# Patient Record
Sex: Female | Born: 1965 | Race: White | Hispanic: No | Marital: Married | Smoking: Current every day smoker
Health system: Southern US, Community
[De-identification: ages and names within clinical notes are randomized; demographics above are authoritative.]

## PROBLEM LIST (undated history)

## (undated) DIAGNOSIS — M940 Chondrocostal junction syndrome [Tietze]: Secondary | ICD-10-CM

## (undated) DIAGNOSIS — F32A Depression, unspecified: Secondary | ICD-10-CM

## (undated) DIAGNOSIS — F329 Major depressive disorder, single episode, unspecified: Secondary | ICD-10-CM

## (undated) DIAGNOSIS — R091 Pleurisy: Secondary | ICD-10-CM

## (undated) DIAGNOSIS — M2550 Pain in unspecified joint: Secondary | ICD-10-CM

## (undated) DIAGNOSIS — M549 Dorsalgia, unspecified: Secondary | ICD-10-CM

## (undated) DIAGNOSIS — M7711 Lateral epicondylitis, right elbow: Secondary | ICD-10-CM

## (undated) DIAGNOSIS — J069 Acute upper respiratory infection, unspecified: Secondary | ICD-10-CM

## (undated) DIAGNOSIS — M542 Cervicalgia: Secondary | ICD-10-CM

## (undated) DIAGNOSIS — G5600 Carpal tunnel syndrome, unspecified upper limb: Secondary | ICD-10-CM

## (undated) DIAGNOSIS — N200 Calculus of kidney: Secondary | ICD-10-CM

## (undated) DIAGNOSIS — R110 Nausea: Secondary | ICD-10-CM

## (undated) DIAGNOSIS — G8929 Other chronic pain: Secondary | ICD-10-CM

## (undated) DIAGNOSIS — Z720 Tobacco use: Secondary | ICD-10-CM

## (undated) DIAGNOSIS — N390 Urinary tract infection, site not specified: Secondary | ICD-10-CM

## (undated) DIAGNOSIS — I471 Supraventricular tachycardia, unspecified: Secondary | ICD-10-CM

## (undated) DIAGNOSIS — G4700 Insomnia, unspecified: Secondary | ICD-10-CM

## (undated) DIAGNOSIS — G500 Trigeminal neuralgia: Secondary | ICD-10-CM

## (undated) DIAGNOSIS — J329 Chronic sinusitis, unspecified: Secondary | ICD-10-CM

## (undated) DIAGNOSIS — G5620 Lesion of ulnar nerve, unspecified upper limb: Secondary | ICD-10-CM

## (undated) DIAGNOSIS — R42 Dizziness and giddiness: Secondary | ICD-10-CM

## (undated) DIAGNOSIS — H8309 Labyrinthitis, unspecified ear: Secondary | ICD-10-CM

## (undated) DIAGNOSIS — E7800 Pure hypercholesterolemia, unspecified: Secondary | ICD-10-CM

## (undated) DIAGNOSIS — R079 Chest pain, unspecified: Secondary | ICD-10-CM

## (undated) DIAGNOSIS — M1990 Unspecified osteoarthritis, unspecified site: Secondary | ICD-10-CM

## (undated) DIAGNOSIS — Z8669 Personal history of other diseases of the nervous system and sense organs: Secondary | ICD-10-CM

## (undated) DIAGNOSIS — J209 Acute bronchitis, unspecified: Secondary | ICD-10-CM

## (undated) DIAGNOSIS — J111 Influenza due to unidentified influenza virus with other respiratory manifestations: Secondary | ICD-10-CM

## (undated) DIAGNOSIS — M79672 Pain in left foot: Secondary | ICD-10-CM

## (undated) DIAGNOSIS — Z87442 Personal history of urinary calculi: Secondary | ICD-10-CM

## (undated) DIAGNOSIS — R002 Palpitations: Secondary | ICD-10-CM

## (undated) DIAGNOSIS — Z973 Presence of spectacles and contact lenses: Secondary | ICD-10-CM

## (undated) DIAGNOSIS — M797 Fibromyalgia: Secondary | ICD-10-CM

## (undated) DIAGNOSIS — R5383 Other fatigue: Secondary | ICD-10-CM

## (undated) DIAGNOSIS — R109 Unspecified abdominal pain: Secondary | ICD-10-CM

## (undated) HISTORY — DX: Chondrocostal junction syndrome (tietze): M94.0

## (undated) HISTORY — PX: WISDOM TOOTH EXTRACTION: SHX21

## (undated) HISTORY — DX: Depression, unspecified: F32.A

## (undated) HISTORY — PX: VAGINAL HYSTERECTOMY: SUR661

## (undated) HISTORY — DX: Other fatigue: R53.83

## (undated) HISTORY — DX: Insomnia, unspecified: G47.00

## (undated) HISTORY — DX: Fibromyalgia: M79.7

## (undated) HISTORY — DX: Nausea: R11.0

## (undated) HISTORY — DX: Trigeminal neuralgia: G50.0

## (undated) HISTORY — DX: Supraventricular tachycardia, unspecified: I47.10

## (undated) HISTORY — DX: Labyrinthitis, unspecified ear: H83.09

## (undated) HISTORY — DX: Urinary tract infection, site not specified: N39.0

## (undated) HISTORY — DX: Pure hypercholesterolemia, unspecified: E78.00

## (undated) HISTORY — DX: Tobacco use: Z72.0

## (undated) HISTORY — DX: Pain in left foot: M79.672

## (undated) HISTORY — DX: Lesion of ulnar nerve, unspecified upper limb: G56.20

## (undated) HISTORY — DX: Acute bronchitis, unspecified: J20.9

## (undated) HISTORY — DX: Pleurisy: R09.1

## (undated) HISTORY — DX: Major depressive disorder, single episode, unspecified: F32.9

## (undated) HISTORY — PX: LITHOTRIPSY: SUR834

## (undated) HISTORY — DX: Palpitations: R00.2

## (undated) HISTORY — DX: Unspecified abdominal pain: R10.9

## (undated) HISTORY — DX: Other chronic pain: G89.29

## (undated) HISTORY — DX: Lateral epicondylitis, right elbow: M77.11

## (undated) HISTORY — DX: Dizziness and giddiness: R42

## (undated) HISTORY — DX: Supraventricular tachycardia: I47.1

## (undated) HISTORY — PX: APPENDECTOMY: SHX54

## (undated) HISTORY — DX: Carpal tunnel syndrome, unspecified upper limb: G56.00

## (undated) HISTORY — DX: Chronic sinusitis, unspecified: J32.9

## (undated) HISTORY — DX: Cervicalgia: M54.2

## (undated) HISTORY — DX: Influenza due to unidentified influenza virus with other respiratory manifestations: J11.1

## (undated) HISTORY — DX: Acute upper respiratory infection, unspecified: J06.9

## (undated) HISTORY — DX: Calculus of kidney: N20.0

## (undated) HISTORY — DX: Dorsalgia, unspecified: M54.9

## (undated) HISTORY — DX: Pain in unspecified joint: M25.50

## (undated) HISTORY — DX: Chest pain, unspecified: R07.9

---
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
## 2005-09-01 HISTORY — PX: VAGINAL HYSTERECTOMY: SUR661

## 2015-03-14 ENCOUNTER — Encounter: Payer: Self-pay | Admitting: *Deleted

## 2015-03-15 ENCOUNTER — Encounter: Payer: Self-pay | Admitting: Neurology

## 2015-03-15 ENCOUNTER — Ambulatory Visit (INDEPENDENT_AMBULATORY_CARE_PROVIDER_SITE_OTHER): Payer: BLUE CROSS/BLUE SHIELD | Admitting: Neurology

## 2015-03-15 VITALS — BP 120/80 | HR 97 | Ht 60.0 in | Wt 111.2 lb

## 2015-03-15 DIAGNOSIS — G500 Trigeminal neuralgia: Secondary | ICD-10-CM | POA: Diagnosis not present

## 2015-03-15 MED ORDER — CARBAMAZEPINE 200 MG PO TABS: 200.0000 mg | ORAL_TABLET | Freq: Two times a day (BID) | ORAL | Status: DC

## 2015-03-15 NOTE — Progress Notes (Signed)
Note sent

## 2015-03-15 NOTE — Patient Instructions (Addendum)
MRI brain Stop gabapentin Start carbamezepine  twice daily.  If you develop any rash, stop the medication.  Return to clinic in 3 months

## 2015-03-15 NOTE — Progress Notes (Signed)
[facility] Clinic Note - Initial Visit   Date: 03/15/2015   Karla Scott MRN: [id number] DOB: 05/30/1966   Dear Dr. Sherryll BurgerShah:  Thank you for your kind referral of Karla Cunasrina Strycharz for consultation of trigeminal neuralgia. Although her history is well known to you, please allow us to reiterate it for the purpose of our medical record. The patient was accompanied to the clinic by husband who also provides collateral information.     History of Present Illness: Karla Cunasrina Mccollum is a 49 y.o. left-handed Caucasian female with fibromyalgia, GERD, SVT, anxiety, kidney stones, and current tobacco use presenting for evaluation of right facial pain.    She reports having spells of right facial electrical sensation, diagnosed as trigeminal neuralgia since 2013.  These would always respond to gabapentin.  Starting in early June 2015, she developed similar symptoms of electrical shock-like pain involving the right temporal and cheek area.  She has radiation of pain into her ear and throat. She reports having sensitivity to light touch.  Pain was triggered by chewing, light pressure, and cool air.  She was started on gabapentin but then developed sore throat and ear pain so was also treated for possible sinusitis.  Because of persistent pain, she was given a course of prednisone which alleviated her pain.  She had mild baseline pain.  She reports having MRI brain in 2013 which was reportedly normal (no report or images available to review).  Prior clinic notes have been reviewed.   She takes gabapentin 100mg  in the morning and 200mg  at bedtime.  She has not tried any other medication.  She feels too drowsy on gabapentin so had restricted her driving, although it does help with her symptoms.   Outside medication records: MRI cervical spine 05/13/2013: Degenerative cervical spondylosis with disc disease and facet disease. Mild left foraminal stenosis at C2-C3 and C3-C4. Diffuse bulging  discs and disc protrusions at C4-5 and C5-6.  Mild foraminal encroachment bilaterally at C5-6.  Labs 12/03/2014:  TSH 1.040   Past Medical History  Diagnosis Date  . Trigeminal neuralgia   . Sinusitis   . Tobacco abuse   . Fibromyalgia   . Chronic pain in left foot   . Acute bronchitis   . Chest pain   . Acute upper respiratory infection   . Backache   . Paroxysmal supraventricular tachycardia   . CTS (carpal tunnel syndrome)   . Palpitations   . Pleurisy   . Cervicalgia   . Depressive disorder   . Hypercholesteremia   . Abdominal pain   . Joint pain   . Influenza   . UTI (urinary tract infection)   . Lateral epicondylitis of right elbow   . Insomnia   . Kidney stone   . Ulnar neuropathy at elbow   . Fatigue   . Costalchondritis   . Nausea   . Dizziness   . Viral labyrinthitis     Past Surgical History  Procedure Laterality Date  . Vaginal hysterectomy       Medications:  Current Outpatient Prescriptions on File Prior to Visit  Medication Sig Dispense Refill  . ALPRAZolam (XANAX) 0.25 MG tablet Take 0.25 mg by mouth 2 (two) times daily as needed for anxiety.    Marland Kitchen. atenolol (TENORMIN) 25 MG tablet Take by mouth daily.    . DULoxetine (CYMBALTA) 60 MG capsule Take 60 mg by mouth daily.    . Fish Oil-Cholecalciferol (FISH OIL + D3 PO) Take by mouth.    .Marland Kitchen  gabapentin (NEURONTIN) 100 MG capsule Take 100 mg by mouth 2 (two) times daily.    Marland Kitchen HYDROcodone-acetaminophen (NORCO/VICODIN) 5-325 MG per tablet Take 1 tablet by mouth every 6 (six) hours as needed for moderate pain. 1 po qd and 2 po qhs    . omeprazole (PRILOSEC) 20 MG capsule Take 20 mg by mouth daily.    Marland Kitchen tiZANidine (ZANAFLEX) 4 MG tablet Take 4 mg by mouth at bedtime.    . traZODone (DESYREL) 100 MG tablet Take 100 mg by mouth at bedtime.     No current facility-administered medications on file prior to visit.    Allergies:  Allergies  Allergen Reactions  . Ambien [Zolpidem Tartrate]   . Lipitor  [Atorvastatin]   . Sulfa Antibiotics     Family History: Family History  Problem Relation Age of Onset  . CVA Father   . Kidney disease Mother     Social History: History   Social History  . Marital Status: Married    Spouse Name: N/A  . Number of Children: N/A  . Years of Education: N/A   Occupational History  . Not on file.   Social History Main Topics  . Smoking status: Current Every Day Smoker  . Smokeless tobacco: Never Used  . Alcohol Use: No  . Drug Use: No  . Sexual Activity: Not on file   Other Topics Concern  . Not on file   Social History Narrative   Lives with husband in a one story home.  Has 2 children.  Works as a Office manager.  Education: 4 year degree.    Review of Systems:  CONSTITUTIONAL: No fevers, chills, night sweats, or weight loss.   EYES: No visual changes or eye pain ENT: No hearing changes.  No history of nose bleeds.   RESPIRATORY: No cough, wheezing and shortness of breath.   CARDIOVASCULAR: Negative for chest pain, and palpitations.   GI: Negative for abdominal discomfort, blood in stools or black stools.  No recent change in bowel habits.   GU:  No history of incontinence.   MUSCLOSKELETAL: No history of joint pain or swelling.  No myalgias.   SKIN: Negative for lesions, rash, and itching.   HEMATOLOGY/ONCOLOGY: Negative for prolonged bleeding, bruising easily, and swollen nodes.  No history of cancer.   ENDOCRINE: Negative for cold or heat intolerance, polydipsia or goiter.   PSYCH:  +depression or anxiety symptoms.   NEURO: As Above.   Vital Signs:  BP 120/80 mmHg  Pulse 97  Ht 5' (1.524 m)  Wt 111 lb 3 oz (50.434 kg)  BMI 21.71 kg/m2  SpO2 97%   General Medical Exam:   General:  Well appearing, comfortable.   Eyes/ENT: see cranial nerve examination.   Neck: No masses appreciated.  Full range of motion without tenderness.  No carotid bruits. Respiratory:  Clear to auscultation, good air entry bilaterally.     Cardiac:  Regular rate and rhythm, no murmur.   Extremities:  No deformities, edema, or skin discoloration.  Skin:  No rashes or lesions.  Neurological Exam: MENTAL STATUS including orientation to time, place, person, recent and remote memory, attention span and concentration, language, and fund of knowledge is normal.  Speech is not dysarthric.  CRANIAL NERVES: II:  No visual field defects.  Unremarkable fundi.   III-IV-VI: Pupils equal round and reactive to light.  Normal conjugate, extra-ocular eye movements in all directions of gaze.  No nystagmus.  No ptosis.   V:  Normal facial sensation.     VII:  Normal facial symmetry and movements.  No pathologic facial reflexes.  VIII:  Normal hearing and vestibular function.   IX-X:  Normal palatal movement.   XI:  Normal shoulder shrug and head rotation.   XII:  Normal tongue strength and range of motion, no deviation or fasciculation.  MOTOR:  No atrophy, fasciculations or abnormal movements.  No pronator drift.  Tone is normal.    Right Upper Extremity:    Left Upper Extremity:    Deltoid  5/5   Deltoid  5/5   Biceps  5/5   Biceps  5/5   Triceps  5/5   Triceps  5/5   Wrist extensors  5/5   Wrist extensors  5/5   Wrist flexors  5/5   Wrist flexors  5/5   Finger extensors  5/5   Finger extensors  5/5   Finger flexors  5/5   Finger flexors  5/5   Dorsal interossei  5/5   Dorsal interossei  5/5   Abductor pollicis  5/5   Abductor pollicis  5/5   Tone (Ashworth scale)  0  Tone (Ashworth scale)  0   Right Lower Extremity:    Left Lower Extremity:    Hip flexors  5/5   Hip flexors  5/5   Hip extensors  5/5   Hip extensors  5/5   Knee flexors  5/5   Knee flexors  5/5   Knee extensors  5/5   Knee extensors  5/5   Dorsiflexors  5/5   Dorsiflexors  5/5   Plantarflexors  5/5   Plantarflexors  5/5   Toe extensors  5/5   Toe extensors  5/5   Toe flexors  5/5   Toe flexors  5/5   Tone (Ashworth scale)  0  Tone (Ashworth scale)  0    MSRs:  Right                                                                 Left brachioradialis 2+  brachioradialis 2+  biceps 2+  biceps 2+  triceps 2+  triceps 2+  patellar 2+  patellar 2+  ankle jerk 2+  ankle jerk 2+  Hoffman no  Hoffman no  plantar response down  plantar response down   SENSORY:  Normal and symmetric perception of light touch, pinprick, vibration, and proprioception.  Romberg's sign absent.   COORDINATION/GAIT: Normal finger-to- nose-finger and heel-to-shin.  Intact rapid alternating movements bilaterally.  Able to rise from a chair without using arms.  Gait narrow based and stable. Tandem and stressed gait intact.    IMPRESSION/PLAN: Mr. Zendejas is a 49 year-old female referred for evaluation of right trigeminal neuralgia, but there are some atypical features such as pain radiating into her ear and throat.  Possible that some of this may be due to sinusitis.  Exam is non-focal and her pain is better after completing steroid taper as well as being on gabapentin /d.  Unfortunately, she is experiencing sedative side effects, so I will switch her to carbamazepine  twice daily.  Common side effect discussed.  I will order MRI brain with attention to trigeminal nerve to look for vascular loop which can sometimes be the underlying  etiology.  Return to clinic in 3 months   The duration of this appointment visit was 40 minutes of face-to-face time with the patient.  Greater than 50% of this time was spent in counseling, explanation of diagnosis, planning of further management, and coordination of care.   Thank you for allowing me to participate in patient's care.  If I can answer any additional questions, I would be pleased to do so.    Sincerely,    Gabby Rackers K. Allena Katz, DO

## 2015-03-20 ENCOUNTER — Ambulatory Visit
Admission: RE | Admit: 2015-03-20 | Discharge: 2015-03-20 | Disposition: A | Discharge status: Home / Self Care | Payer: BLUE CROSS/BLUE SHIELD | Source: Ambulatory Visit | Attending: Neurology | Admitting: Neurology

## 2015-03-20 DIAGNOSIS — G500 Trigeminal neuralgia: Secondary | ICD-10-CM

## 2015-03-20 MED ORDER — GADOBENATE DIMEGLUMINE 529 MG/ML IV SOLN
10.0000 mL | Freq: Once | INTRAVENOUS | Status: AC | PRN
  Administered 2015-03-20: 10 mL via INTRAVENOUS

## 2015-06-15 ENCOUNTER — Other Ambulatory Visit: Payer: Self-pay | Admitting: Neurology

## 2015-06-15 NOTE — Telephone Encounter (Signed)
Rx sent 

## 2015-06-21 ENCOUNTER — Ambulatory Visit: Payer: 59 | Admitting: Neurology

## 2015-09-02 HISTORY — PX: CYSTOSCOPY W/ URETEROSCOPY W/ LITHOTRIPSY: SUR380

## 2015-09-13 ENCOUNTER — Encounter: Payer: Self-pay | Admitting: Neurology

## 2015-09-13 ENCOUNTER — Ambulatory Visit (INDEPENDENT_AMBULATORY_CARE_PROVIDER_SITE_OTHER): Payer: BLUE CROSS/BLUE SHIELD | Admitting: Neurology

## 2015-09-13 VITALS — BP 110/80 | HR 96 | Wt 114.5 lb

## 2015-09-13 DIAGNOSIS — G500 Trigeminal neuralgia: Secondary | ICD-10-CM | POA: Diagnosis not present

## 2015-09-13 MED ORDER — OXCARBAZEPINE 150 MG PO TABS: 150.0000 mg | ORAL_TABLET | Freq: Two times a day (BID) | ORAL | Status: DC

## 2015-09-13 NOTE — Progress Notes (Signed)
Follow-up Visit   Date: 09/13/2015    Karla Scott MRN: [id number] DOB: February 19, 1966   Interim History: Karla Scott is a 50 y.o. left-handed Caucasian female with fibromyalgia, GERD, SVT, anxiety, kidney stones, and current tobacco use returning to the clinic for follow-up of right trigeminal neuralgia.  The patient was accompanied to the clinic by aunt who also provides collateral information.    History of present illness: She reports having spells of right facial electrical sensation, diagnosed as trigeminal neuralgia since 2013. These would always respond to gabapentin. Starting in early June 2015, she developed similar symptoms of electrical shock-like pain involving the right temporal and cheek area. She has radiation of pain into her ear and throat. She reports having sensitivity to light touch. Pain was triggered by chewing, light pressure, and cool air. She was started on gabapentin but then developed sore throat and ear pain so was also treated for possible sinusitis. Because of persistent pain, she was given a course of prednisone which alleviated her pain. She had mild baseline pain. She reports having MRI brain in 2013 which was reportedly normal (no report or images available to review). Prior clinic notes have been reviewed.   She takes gabapentin 100mg  in the morning and 200mg  at bedtime. She feels too drowsy on gabapentin so had restricted her driving, although it does help with her symptoms.   UPDATE 09/13/2015:   She noticed a huge improvement in her pain with carbamazepine 200mg  twice daily, but did have some difficulty focusing, lightheadedness, and nausea.  She tried reducing the dose to 100mg  at bedtime but felt that her pain would worsen, so she resumed taking it 200mg  twice daily.  Her last bout of pain was in early December.  She no longer has pain radiating into her ear and throat and reports that this may have been due to sinusitis and unrelated to her  facial pain.     Medications:  Current Outpatient Prescriptions on File Prior to Visit  Medication Sig Dispense Refill  . ALPRAZolam (XANAX) 0.25 MG tablet Take 0.25 mg by mouth 2 (two) times daily as needed for anxiety.    Marland Kitchen atenolol (TENORMIN) 25 MG tablet Take by mouth daily.    . DULoxetine (CYMBALTA) 60 MG capsule Take 60 mg by mouth daily.    . Fish Oil-Cholecalciferol (FISH OIL + D3 PO) Take by mouth.    Marland Kitchen HYDROcodone-acetaminophen (NORCO/VICODIN) 5-325 MG per tablet Take 1 tablet by mouth every 6 (six) hours as needed for moderate pain. 1 po qd and 2 po qhs    . omeprazole (PRILOSEC) 20 MG capsule Take 20 mg by mouth daily.    Marland Kitchen tiZANidine (ZANAFLEX) 4 MG tablet Take 4 mg by mouth at bedtime.    . traZODone (DESYREL) 100 MG tablet Take 100 mg by mouth at bedtime.     No current facility-administered medications on file prior to visit.    Allergies:  Allergies  Allergen Reactions  . Ambien [Zolpidem Tartrate]   . Lipitor [Atorvastatin]   . Sulfa Antibiotics     Review of Systems:  CONSTITUTIONAL: No fevers, chills, night sweats, or weight loss.  EYES: No visual changes or eye pain ENT: No hearing changes.  No history of nose bleeds.   RESPIRATORY: No cough, wheezing and shortness of breath.   CARDIOVASCULAR: Negative for chest pain, and palpitations.   GI: Negative for abdominal discomfort, blood in stools or black stools.  No recent change in bowel habits.  GU:  No history of incontinence.   MUSCLOSKELETAL: No history of joint pain or swelling.  No myalgias.   SKIN: Negative for lesions, rash, and itching.   ENDOCRINE: Negative for cold or heat intolerance, polydipsia or goiter.   PSYCH:  + depression or anxiety symptoms.   NEURO: As Above.   Vital Signs:  BP 110/80 mmHg  Pulse 96  Wt 114 lb 8 oz (51.937 kg)  SpO2 96%  Neurological Exam: MENTAL STATUS including orientation to time, place, person, recent and remote memory, attention span and concentration,  language, and fund of knowledge is normal.  Speech is not dysarthric.  CRANIAL NERVES:  Pupils equal round and reactive to light.  Normal conjugate, extra-ocular eye movements in all directions of gaze.  No ptosis. Normal facial sensation.  Face is symmetric. Palate elevates symmetrically.  Tongue is midline.  MOTOR:  Motor strength is 5/5 in all extremities  No pronator drift.  Tone is normal.    MSRs:  Reflexes are 2+/4 throughout.  SENSORY:  Intact to light touch and vibration throughout.  COORDINATION/GAIT:    Gait narrow based and stable.   Data: MRI cervical spine 05/13/2013:  Degenerative cervical spondylosis with disc disease and facet disease.  Mild left foraminal stenosis at C2-C3 and C3-C4.  Diffuse bulging discs and disc protrusions at C4-5 and C5-6. Mild foraminal encroachment bilaterally at C5-6.  MRI brain/orbit wwo contrast 03/20/2015:  No explanation for right facial pain.  Labs 12/03/2014: TSH 1.040   IMPRESSION/PLAN: Right trigeminal neuralgia, improved on carbamazepine but she is experiencing side effects of lightheadedness and cognitive slowing, so will switch her to oxcarbamazepine 150mg  twice daily and uptitrate as needed.  Common side effects discussed.  She can follow-up with her pain provider for trigeminal nerve block, if needed. Call with update in 1 month to decide if oxcarbamazepine needs to be increased to 300mg  twice daily From a neurological stand-point, there are no driving restrictions. I recommend she use her own judgement and safety precautions when driving.    Return to clinic as needed   The duration of this appointment visit was 25 minutes of face-to-face time with the patient.  Greater than 50% of this time was spent in counseling, explanation of diagnosis, planning of further management, and coordination of care.   Thank you for allowing me to participate in patient's care.  If I can answer any additional questions, I would be pleased to do so.      Sincerely,    Leydi Winstead K. Allena KatzPatel, DO

## 2015-09-13 NOTE — Progress Notes (Signed)
Note routed

## 2015-09-13 NOTE — Patient Instructions (Addendum)
Start oxcarbamazepine 150mg  twice daily. Stop carbamazepine  Call the office with an update in 1 month

## 2015-09-14 ENCOUNTER — Telehealth: Payer: Self-pay | Admitting: Neurology

## 2015-09-14 NOTE — Telephone Encounter (Signed)
Left another message for [facility].

## 2015-09-14 NOTE — Telephone Encounter (Signed)
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

## 2015-09-14 NOTE — Telephone Encounter (Signed)
Left message on Karla Scott's vm.

## 2015-10-05 ENCOUNTER — Encounter: Payer: Self-pay | Admitting: Neurology

## 2015-12-24 HISTORY — PX: CRANIOTOMY: SHX93

## 2016-05-28 ENCOUNTER — Other Ambulatory Visit: Payer: Self-pay | Admitting: Internal Medicine

## 2016-05-28 DIAGNOSIS — R319 Hematuria, unspecified: Secondary | ICD-10-CM

## 2016-05-28 DIAGNOSIS — R109 Unspecified abdominal pain: Secondary | ICD-10-CM

## 2016-05-28 DIAGNOSIS — N200 Calculus of kidney: Secondary | ICD-10-CM

## 2016-05-29 ENCOUNTER — Inpatient Hospital Stay (HOSPITAL_COMMUNITY): Payer: BLUE CROSS/BLUE SHIELD | Admitting: Registered Nurse

## 2016-05-29 ENCOUNTER — Inpatient Hospital Stay (HOSPITAL_COMMUNITY)
Admission: EM | Admit: 2016-05-29 | Discharge: 2016-05-30 | DRG: 872 | Disposition: A | Discharge status: Home / Self Care | Payer: BLUE CROSS/BLUE SHIELD | Attending: Internal Medicine | Admitting: Internal Medicine

## 2016-05-29 ENCOUNTER — Encounter (HOSPITAL_COMMUNITY): Payer: Self-pay | Admitting: Emergency Medicine

## 2016-05-29 ENCOUNTER — Ambulatory Visit
Admission: RE | Admit: 2016-05-29 | Discharge: 2016-05-29 | Disposition: A | Discharge status: Home / Self Care | Payer: BLUE CROSS/BLUE SHIELD | Source: Ambulatory Visit | Attending: Internal Medicine | Admitting: Internal Medicine

## 2016-05-29 ENCOUNTER — Encounter (HOSPITAL_COMMUNITY)
Admission: EM | Disposition: A | Discharge status: Home / Self Care | Payer: Self-pay | Source: Home / Self Care | Attending: Family Medicine

## 2016-05-29 DIAGNOSIS — A419 Sepsis, unspecified organism: Secondary | ICD-10-CM | POA: Diagnosis not present

## 2016-05-29 DIAGNOSIS — N1330 Unspecified hydronephrosis: Secondary | ICD-10-CM | POA: Diagnosis not present

## 2016-05-29 DIAGNOSIS — N200 Calculus of kidney: Secondary | ICD-10-CM | POA: Diagnosis not present

## 2016-05-29 DIAGNOSIS — G500 Trigeminal neuralgia: Secondary | ICD-10-CM | POA: Diagnosis present

## 2016-05-29 DIAGNOSIS — N136 Pyonephrosis: Secondary | ICD-10-CM | POA: Diagnosis present

## 2016-05-29 DIAGNOSIS — E876 Hypokalemia: Secondary | ICD-10-CM | POA: Diagnosis not present

## 2016-05-29 DIAGNOSIS — M797 Fibromyalgia: Secondary | ICD-10-CM | POA: Diagnosis present

## 2016-05-29 DIAGNOSIS — A4151 Sepsis due to Escherichia coli [E. coli]: Principal | ICD-10-CM | POA: Diagnosis present

## 2016-05-29 DIAGNOSIS — F17210 Nicotine dependence, cigarettes, uncomplicated: Secondary | ICD-10-CM | POA: Diagnosis present

## 2016-05-29 DIAGNOSIS — E7800 Pure hypercholesterolemia, unspecified: Secondary | ICD-10-CM | POA: Diagnosis present

## 2016-05-29 DIAGNOSIS — R319 Hematuria, unspecified: Secondary | ICD-10-CM

## 2016-05-29 DIAGNOSIS — F329 Major depressive disorder, single episode, unspecified: Secondary | ICD-10-CM | POA: Diagnosis present

## 2016-05-29 DIAGNOSIS — F419 Anxiety disorder, unspecified: Secondary | ICD-10-CM | POA: Diagnosis present

## 2016-05-29 DIAGNOSIS — N202 Calculus of kidney with calculus of ureter: Secondary | ICD-10-CM | POA: Diagnosis present

## 2016-05-29 DIAGNOSIS — Z882 Allergy status to sulfonamides status: Secondary | ICD-10-CM | POA: Diagnosis not present

## 2016-05-29 DIAGNOSIS — Z888 Allergy status to other drugs, medicaments and biological substances status: Secondary | ICD-10-CM | POA: Diagnosis not present

## 2016-05-29 DIAGNOSIS — Z841 Family history of disorders of kidney and ureter: Secondary | ICD-10-CM

## 2016-05-29 DIAGNOSIS — F17200 Nicotine dependence, unspecified, uncomplicated: Secondary | ICD-10-CM | POA: Diagnosis present

## 2016-05-29 DIAGNOSIS — R109 Unspecified abdominal pain: Secondary | ICD-10-CM | POA: Diagnosis present

## 2016-05-29 DIAGNOSIS — G4700 Insomnia, unspecified: Secondary | ICD-10-CM | POA: Diagnosis present

## 2016-05-29 DIAGNOSIS — N201 Calculus of ureter: Secondary | ICD-10-CM

## 2016-05-29 HISTORY — PX: CYSTOSCOPY WITH STENT PLACEMENT: SHX5790

## 2016-05-29 LAB — BASIC METABOLIC PANEL
Anion gap: 7 (ref 5–15)
BUN: 12 mg/dL (ref 6–20)
CO2: 26 mmol/L (ref 22–32)
CREATININE: 0.85 mg/dL (ref 0.44–1.00)
Calcium: 8.8 mg/dL — ABNORMAL LOW (ref 8.9–10.3)
Chloride: 104 mmol/L (ref 101–111)
GFR calc Af Amer: >60 mL/min (ref >60)
GFR calc non Af Amer: >60 mL/min (ref >60)
Glucose, Bld: 99 mg/dL (ref 65–99)
Potassium: 3.4 mmol/L — ABNORMAL LOW (ref 3.5–5.1)
SODIUM: 137 mmol/L (ref 135–145)

## 2016-05-29 LAB — CBC WITH DIFFERENTIAL/PLATELET
Basophils Absolute: 0 10*3/uL (ref 0.0–0.1)
Basophils Relative: 0 %
EOS ABS: 0 10*3/uL (ref 0.0–0.7)
Eosinophils Relative: 0 %
HCT: 41.1 % (ref 36.0–46.0)
Hemoglobin: 13.9 g/dL (ref 12.0–15.0)
Lymphocytes Relative: 12 %
Lymphs Abs: 1.9 10*3/uL (ref 0.7–4.0)
MCH: 33 pg (ref 26.0–34.0)
MCHC: 33.8 g/dL (ref 30.0–36.0)
MCV: 97.6 fL (ref 78.0–100.0)
MONO ABS: 1 10*3/uL (ref 0.1–1.0)
MONOS PCT: 7 %
NEUTROS PCT: 81 %
Neutro Abs: 12.2 10*3/uL — ABNORMAL HIGH (ref 1.7–7.7)
Platelets: 233 10*3/uL (ref 150–400)
RBC: 4.21 MIL/uL (ref 3.87–5.11)
RDW: 12.6 % (ref 11.5–15.5)
WBC: 15.1 10*3/uL — ABNORMAL HIGH (ref 4.0–10.5)

## 2016-05-29 LAB — URINALYSIS, ROUTINE W REFLEX MICROSCOPIC
BILIRUBIN URINE: NEGATIVE
GLUCOSE, UA: NEGATIVE mg/dL
Ketones, ur: NEGATIVE mg/dL
Nitrite: POSITIVE — AB
PH: 6 (ref 5.0–8.0)
Protein, ur: 30 mg/dL — AB
SPECIFIC GRAVITY, URINE: 1.01 (ref 1.005–1.030)

## 2016-05-29 LAB — LACTIC ACID, PLASMA
LACTIC ACID, VENOUS: 1.1 mmol/L (ref 0.5–1.9)
Lactic Acid, Venous: 1.2 mmol/L (ref 0.5–1.9)

## 2016-05-29 LAB — URINE MICROSCOPIC-ADD ON

## 2016-05-29 SURGERY — CYSTOSCOPY, WITH STENT INSERTION
Anesthesia: General | Site: Ureter | Laterality: Right

## 2016-05-29 MED ORDER — CIPROFLOXACIN IN D5W 400 MG/200ML IV SOLN
400.0000 mg | Freq: Once | INTRAVENOUS | Status: AC
  Administered 2016-05-29: 400 mg via INTRAVENOUS

## 2016-05-29 MED ORDER — DEXAMETHASONE SODIUM PHOSPHATE 10 MG/ML IJ SOLN
INTRAMUSCULAR | Status: DC | PRN
  Administered 2016-05-29: 10 mg via INTRAVENOUS

## 2016-05-29 MED ORDER — PROMETHAZINE HCL 25 MG/ML IJ SOLN: 6.2500 mg | INTRAMUSCULAR | Status: DC | PRN

## 2016-05-29 MED ORDER — MEPERIDINE HCL 25 MG/ML IJ SOLN: 6.2500 mg | INTRAMUSCULAR | Status: DC | PRN

## 2016-05-29 MED ORDER — ONDANSETRON HCL 4 MG/2ML IJ SOLN
INTRAMUSCULAR | Status: DC | PRN
  Administered 2016-05-29: 4 mg via INTRAVENOUS

## 2016-05-29 MED ORDER — ALPRAZOLAM 0.25 MG PO TABS: 0.2500 mg | ORAL_TABLET | Freq: Two times a day (BID) | ORAL | Status: DC | PRN

## 2016-05-29 MED ORDER — BELLADONNA ALKALOIDS-OPIUM 16.2-60 MG RE SUPP
RECTAL | Status: DC | PRN
  Administered 2016-05-29: 1 via RECTAL

## 2016-05-29 MED ORDER — BELLADONNA ALKALOIDS-OPIUM 16.2-60 MG RE SUPP
RECTAL | Status: AC
  Filled 2016-05-29: qty 1

## 2016-05-29 MED ORDER — SODIUM CHLORIDE 0.9 % IV SOLN: 250.0000 mL | INTRAVENOUS | Status: DC | PRN

## 2016-05-29 MED ORDER — HYDROCODONE-ACETAMINOPHEN 5-325 MG PO TABS: 1.0000 | ORAL_TABLET | Freq: Four times a day (QID) | ORAL | Status: DC | PRN

## 2016-05-29 MED ORDER — LIDOCAINE HCL (CARDIAC) 20 MG/ML IV SOLN
INTRAVENOUS | Status: DC | PRN
  Administered 2016-05-29: 75 mg via INTRAVENOUS
  Administered 2016-05-29: 25 mg via INTRATRACHEAL

## 2016-05-29 MED ORDER — DEXTROSE 5 % IV SOLN
1.0000 g | Freq: Once | INTRAVENOUS | Status: AC
  Administered 2016-05-29: 1 g via INTRAVENOUS
  Filled 2016-05-29: qty 10

## 2016-05-29 MED ORDER — TRAZODONE HCL 50 MG PO TABS
150.0000 mg | ORAL_TABLET | Freq: Every day | ORAL | Status: DC
  Administered 2016-05-29: 150 mg via ORAL
  Filled 2016-05-29: qty 3

## 2016-05-29 MED ORDER — ACETAMINOPHEN 325 MG PO TABS: 650.0000 mg | ORAL_TABLET | Freq: Four times a day (QID) | ORAL | Status: DC | PRN

## 2016-05-29 MED ORDER — ACETAMINOPHEN 650 MG RE SUPP: 650.0000 mg | Freq: Four times a day (QID) | RECTAL | Status: DC | PRN

## 2016-05-29 MED ORDER — ROCURONIUM BROMIDE 100 MG/10ML IV SOLN
INTRAVENOUS | Status: DC | PRN
  Administered 2016-05-29: 5 mg via INTRAVENOUS

## 2016-05-29 MED ORDER — CIPROFLOXACIN IN D5W 400 MG/200ML IV SOLN: 400.0000 mg | INTRAVENOUS | Status: DC

## 2016-05-29 MED ORDER — DULOXETINE HCL 60 MG PO CPEP
60.0000 mg | ORAL_CAPSULE | Freq: Every day | ORAL | Status: DC
  Administered 2016-05-30: 60 mg via ORAL
  Filled 2016-05-29: qty 1

## 2016-05-29 MED ORDER — DEXAMETHASONE SODIUM PHOSPHATE 10 MG/ML IJ SOLN
INTRAMUSCULAR | Status: AC
  Filled 2016-05-29: qty 1

## 2016-05-29 MED ORDER — SODIUM CHLORIDE 0.9% FLUSH: 3.0000 mL | Freq: Two times a day (BID) | INTRAVENOUS | Status: DC

## 2016-05-29 MED ORDER — LACTATED RINGERS IV SOLN
INTRAVENOUS | Status: DC | PRN
  Administered 2016-05-29 (×2): via INTRAVENOUS

## 2016-05-29 MED ORDER — CIPROFLOXACIN IN D5W 400 MG/200ML IV SOLN
INTRAVENOUS | Status: AC
  Filled 2016-05-29: qty 200

## 2016-05-29 MED ORDER — DEXTROSE 5 % IV SOLN
1.0000 g | INTRAVENOUS | Status: DC
  Filled 2016-05-29: qty 10

## 2016-05-29 MED ORDER — MORPHINE SULFATE (PF) 4 MG/ML IV SOLN
4.0000 mg | INTRAVENOUS | Status: DC | PRN
  Administered 2016-05-29: 4 mg via INTRAVENOUS
  Filled 2016-05-29: qty 1

## 2016-05-29 MED ORDER — ONDANSETRON HCL 4 MG/2ML IJ SOLN
INTRAMUSCULAR | Status: AC
  Filled 2016-05-29: qty 2

## 2016-05-29 MED ORDER — MIDAZOLAM HCL 2 MG/2ML IJ SOLN
INTRAMUSCULAR | Status: AC
  Filled 2016-05-29: qty 2

## 2016-05-29 MED ORDER — SODIUM CHLORIDE 0.9 % IV BOLUS (SEPSIS)
1000.0000 mL | Freq: Once | INTRAVENOUS | Status: AC
Start: 2016-05-29 — End: 2016-05-29
  Administered 2016-05-29: 1000 mL via INTRAVENOUS

## 2016-05-29 MED ORDER — SODIUM CHLORIDE 0.9 % IV SOLN
INTRAVENOUS | Status: DC
  Administered 2016-05-29 – 2016-05-30 (×3): via INTRAVENOUS

## 2016-05-29 MED ORDER — HYDROMORPHONE HCL 1 MG/ML IJ SOLN
0.2500 mg | INTRAMUSCULAR | Status: DC | PRN
  Administered 2016-05-29 (×2): 0.5 mg via INTRAVENOUS

## 2016-05-29 MED ORDER — HYDROMORPHONE HCL 1 MG/ML IJ SOLN
INTRAMUSCULAR | Status: AC
  Filled 2016-05-29: qty 1

## 2016-05-29 MED ORDER — SUCCINYLCHOLINE CHLORIDE 20 MG/ML IJ SOLN
INTRAMUSCULAR | Status: DC | PRN
  Administered 2016-05-29: 100 mg via INTRAVENOUS

## 2016-05-29 MED ORDER — FENTANYL CITRATE (PF) 100 MCG/2ML IJ SOLN
INTRAMUSCULAR | Status: DC | PRN
  Administered 2016-05-29 (×2): 50 ug via INTRAVENOUS

## 2016-05-29 MED ORDER — SODIUM CHLORIDE 0.9% FLUSH: 3.0000 mL | INTRAVENOUS | Status: DC | PRN

## 2016-05-29 MED ORDER — ACETAMINOPHEN 10 MG/ML IV SOLN
INTRAVENOUS | Status: DC | PRN
  Administered 2016-05-29: 1000 mg via INTRAVENOUS

## 2016-05-29 MED ORDER — KETOROLAC TROMETHAMINE 30 MG/ML IJ SOLN
INTRAMUSCULAR | Status: DC | PRN
  Administered 2016-05-29: 30 mg via INTRAVENOUS

## 2016-05-29 MED ORDER — ONDANSETRON HCL 4 MG/2ML IJ SOLN: 4.0000 mg | Freq: Four times a day (QID) | INTRAMUSCULAR | Status: DC | PRN

## 2016-05-29 MED ORDER — MEPERIDINE HCL 50 MG/ML IJ SOLN: 6.2500 mg | INTRAMUSCULAR | Status: DC | PRN

## 2016-05-29 MED ORDER — SODIUM CHLORIDE 0.9 % IR SOLN
Status: DC | PRN
  Administered 2016-05-29: 3000 mL

## 2016-05-29 MED ORDER — OXYCODONE HCL 5 MG PO TABS: 5.0000 mg | ORAL_TABLET | ORAL | Status: DC | PRN

## 2016-05-29 MED ORDER — SODIUM CHLORIDE 0.9 % IV BOLUS (SEPSIS)
1000.0000 mL | Freq: Once | INTRAVENOUS | Status: AC
  Administered 2016-05-29: 1000 mL via INTRAVENOUS

## 2016-05-29 MED ORDER — IOHEXOL 300 MG/ML  SOLN
INTRAMUSCULAR | Status: DC | PRN
  Administered 2016-05-29: 5 mL via URETHRAL

## 2016-05-29 MED ORDER — ONDANSETRON HCL 4 MG PO TABS: 4.0000 mg | ORAL_TABLET | Freq: Four times a day (QID) | ORAL | Status: DC | PRN

## 2016-05-29 MED ORDER — FENTANYL CITRATE (PF) 100 MCG/2ML IJ SOLN
INTRAMUSCULAR | Status: AC
  Filled 2016-05-29: qty 2

## 2016-05-29 MED ORDER — POLYETHYLENE GLYCOL 3350 17 G PO PACK: 17.0000 g | PACK | Freq: Every day | ORAL | Status: DC | PRN

## 2016-05-29 MED ORDER — TRAZODONE HCL 50 MG PO TABS: 50.0000 mg | ORAL_TABLET | Freq: Every evening | ORAL | Status: DC | PRN

## 2016-05-29 MED ORDER — MIDAZOLAM HCL 5 MG/5ML IJ SOLN
INTRAMUSCULAR | Status: DC | PRN
  Administered 2016-05-29: 1 mg via INTRAVENOUS

## 2016-05-29 MED ORDER — SENNA 8.6 MG PO TABS
1.0000 | ORAL_TABLET | Freq: Two times a day (BID) | ORAL | Status: DC
  Administered 2016-05-29 – 2016-05-30 (×2): 8.6 mg via ORAL
  Filled 2016-05-29 (×2): qty 1

## 2016-05-29 MED ORDER — PROPOFOL 10 MG/ML IV BOLUS
INTRAVENOUS | Status: AC
  Filled 2016-05-29: qty 20

## 2016-05-29 MED ORDER — ALBUTEROL SULFATE (2.5 MG/3ML) 0.083% IN NEBU: 2.5000 mg | INHALATION_SOLUTION | RESPIRATORY_TRACT | Status: DC | PRN

## 2016-05-29 MED ORDER — PROPOFOL 10 MG/ML IV BOLUS
INTRAVENOUS | Status: DC | PRN
  Administered 2016-05-29: 25 mg via INTRAVENOUS
  Administered 2016-05-29: 175 mg via INTRAVENOUS

## 2016-05-29 MED ORDER — ACETAMINOPHEN 10 MG/ML IV SOLN
INTRAVENOUS | Status: AC
  Filled 2016-05-29: qty 100

## 2016-05-29 SURGICAL SUPPLY — 16 items
BAG URO CATCHER STRL LF (MISCELLANEOUS) ×3 IMPLANT
BASKET ZERO TIP NITINOL 2.4FR (BASKET) IMPLANT
CATH FOLEY 2WAY SLVR  5CC 16FR (CATHETERS) ×2
CATH FOLEY 2WAY SLVR 5CC 16FR (CATHETERS) ×1 IMPLANT
CATH INTERMIT  6FR 70CM (CATHETERS) ×3 IMPLANT
CLOTH BEACON ORANGE TIMEOUT ST (SAFETY) ×3 IMPLANT
GLOVE BIOGEL M STRL SZ7.5 (GLOVE) ×9 IMPLANT
GOWN STRL REUS W/TWL LRG LVL3 (GOWN DISPOSABLE) ×3 IMPLANT
GOWN STRL REUS W/TWL XL LVL3 (GOWN DISPOSABLE) ×3 IMPLANT
GUIDEWIRE ANG ZIPWIRE 038X150 (WIRE) IMPLANT
GUIDEWIRE STR DUAL SENSOR (WIRE) ×3 IMPLANT
MANIFOLD NEPTUNE II (INSTRUMENTS) ×3 IMPLANT
PACK CYSTO (CUSTOM PROCEDURE TRAY) ×3 IMPLANT
STENT CONTOUR 6FRX24X.038 (STENTS) ×3 IMPLANT
TUBING CONNECTING 10 (TUBING) ×2 IMPLANT
TUBING CONNECTING 10' (TUBING) ×1

## 2016-05-29 NOTE — ED Notes (Signed)
Pt made aware a urine specimen was needed. Pt verbalized understanding and will notify staff when one can be obtained.   

## 2016-05-29 NOTE — Anesthesia Procedure Notes (Signed)
Procedure Name: Intubation Date/Time: 05/29/2016 9:58 PM Performed by: Illene SilverEVANS, Mikie Misner E Pre-anesthesia Checklist: Patient identified, Emergency Drugs available, Suction available and Patient being monitored Patient Re-evaluated:Patient Re-evaluated prior to inductionOxygen Delivery Method: Circle system utilized Preoxygenation: Pre-oxygenation with 100% oxygen Intubation Type: IV induction Ventilation: Mask ventilation without difficulty Laryngoscope Size: Mac and 3 Grade View: Grade II Tube type: Oral Tube size: 7.0 mm Number of attempts: 1 Airway Equipment and Method: Stylet and Oral airway Placement Confirmation: ETT inserted through vocal cords under direct vision,  positive ETCO2 and breath sounds checked- equal and bilateral Secured at: 21 cm Tube secured with: Tape Dental Injury: Teeth and Oropharynx as per pre-operative assessment

## 2016-05-29 NOTE — ED Notes (Signed)
EDP at bedside updating patient and family. 

## 2016-05-29 NOTE — Op Note (Signed)
Preoperative diagnosis: Right distal ureteral stone, hydronephrosis, UTI, sepsis Postoperative diagnosis: Same  Procedure: Cystoscopy, right retrograde pyelogram, right ureteral stent placement  Surgeon: Mena GoesEskridge  Anesthesia: Gen.  Indication for procedure 50 year old female with above diagnosis brought for urgent ureteral stenting.  Findings: On cystoscopy the urethra and the bladder unremarkable. There were no stones or foreign bodies in the bladder. There was some edema and erythema over the right ureteral orifice.  Right retrograde pyelogram-this outlined a single ureter single collecting system unit with a filling defect in the intramural portion of the very distal ureter with proximal hydroureteronephrosis. Initial retrograde only progressed contrast to the mid ureter so I repeated it once I was able to get 6 [place] open-ended catheter past the stone.  Description of procedure: After consent was obtained patient brought to the operating room. After adequate anesthesia she was placed in lithotomy position and prepped and draped in the usual sterile fashion. A timeout was performed to confirm the patient and procedure. The cystoscope was passed per urethra and the bladder inspected. A 6 French open-ended catheter was used to cannulate the right ureteral orifice and retrograde injection of contrast was performed. I could not see the stone crowning and therefore felt ureteroscopy would be too risky. A sensor wire was advanced past the stone and I tried to pass a 6 [place] open-ended catheter but there was quite a bit of resistance. With some pressure on the catheter it did pass retrograde and I passed it into the proximal ureter and remove the wire. There was a good hydronephrotic drip. I reinjected some contrast which confirmed proper placement in the lumen. Contrast did fill proximally and distally down to the ureterovesical junction and the ureter was noted to be intact. The sensor wire was  readvanced into the upper pole collecting system in the 6 [place] open-ended removed. A 6 x 24 cm stent was advanced. The wire was removed with a good coil seen in the right renal pelvis and a good coil in the bladder. A 16 French Foley was placed a max drain the system overnight and connected to gravity drainage. She was awakened and taken to the recovery room in stable condition.  Complications: None Blood loss: Minimal Specimens: None Drains: 6 x 24 cm right ureteral stent

## 2016-05-29 NOTE — Consult Note (Signed)
Consult: sepsis, right ureteral stone, hydronephrosis Requested by: Dr. Manus Gunningancour; Dr. Shon Haleourage Emokpae  History of Present Illness: 50 yo female s/p CT this AM which shows a 5 mm distal right stone. She has sepsis and a dirty UA and needs an urgent stent. She started with right flank pain, N/V last night. She has not felt a stone pass and continues to have pain.   She has a history of kidney stones with stone passage and ESWL utilized in the past. She had not had URS or a stent. She typically follows with Dr. Nechama GuardBauer.   Past Medical History:  Diagnosis Date  . Abdominal pain   . Acute bronchitis   . Acute upper respiratory infection   . Backache   . Cervicalgia   . Chest pain   . Chronic pain in left foot   . Costalchondritis   . CTS (carpal tunnel syndrome)   . Depressive disorder   . Dizziness   . Fatigue   . Fibromyalgia   . Hypercholesteremia   . Influenza   . Insomnia   . Joint pain   . Kidney stone   . Lateral epicondylitis of right elbow   . Nausea   . Palpitations   . Paroxysmal supraventricular tachycardia (HCC)   . Pleurisy   . Sinusitis   . Tobacco abuse   . Trigeminal neuralgia   . Ulnar neuropathy at elbow   . UTI (urinary tract infection)   . Viral labyrinthitis    Past Surgical History:  Procedure Laterality Date  . VAGINAL HYSTERECTOMY      Home Medications:  Prescriptions Prior to Admission  Medication Sig Dispense Refill Last Dose  . ALPRAZolam (XANAX) 0.25 MG tablet Take 0.25 mg by mouth 2 (two) times daily as needed for anxiety.   Past Week at Unknown time  . DULoxetine (CYMBALTA) 60 MG capsule Take 60 mg by mouth daily.   05/29/2016 at Unknown time  . Fish Oil-Cholecalciferol (FISH OIL + D3 PO) Take by mouth.   05/29/2016 at Unknown time  . HYDROcodone-acetaminophen (NORCO/VICODIN) 5-325 MG per tablet Take 1 tablet by mouth every 6 (six) hours as needed for moderate pain. 1 po qd and 2 po qhs   Past Week at Unknown time  . traZODone (DESYREL) 150 MG  tablet Take 150 mg by mouth at bedtime.    05/28/2016 at Unknown time   Allergies:  Allergies  Allergen Reactions  . Ambien [Zolpidem Tartrate]   . Lipitor [Atorvastatin]   . Sulfa Antibiotics     Family History  Problem Relation Age of Onset  . Kidney disease Mother   . CVA Father   . Kidney Stones Sister   . Heart attack Sister   . Healthy Son    Social History:  reports that she has been smoking Cigarettes.  She has a 15.00 pack-year smoking history. She has never used smokeless tobacco. She reports that she drinks alcohol. She reports that she does not use drugs.  ROS: A complete review of systems was performed.  All systems are negative except for pertinent findings as noted. Review of Systems  All other systems reviewed and are negative.    Physical Exam:  Vital signs in last 24 hours: Temp:  [99.3 F (37.4 C)-101 F (38.3 C)] 100.8 F (38.2 C) (09/28 2018) Pulse Rate:  [80-102] 101 (09/28 2018) Resp:  [14-20] 18 (09/28 2018) BP: (100-120)/(41-69) 120/57 (09/28 2018) SpO2:  [95 %-98 %] 95 % (09/28 2018) Weight:  [52.6 kg (  116 lb)-52.9 kg (116 lb 10 oz)] 52.9 kg (116 lb 10 oz) (09/28 2018) General:  Alert and oriented, No acute distress but looks ill.  HEENT: Normocephalic, atraumatic Cardiovascular: Regular rate and rhythm Lungs: Regular rate and effort Extremities: No edema Neurologic: Grossly intact  Laboratory Data:  Results for orders placed or performed during the hospital encounter of 05/29/16 (from the past 24 hour(s))  Urinalysis, Routine w reflex microscopic (not at [facility])     Status: Abnormal   Collection Time: 05/29/16  4:11 PM  Result Value Ref Range   Color, Urine YELLOW YELLOW   APPearance HAZY (A) CLEAR   Specific Gravity, Urine 1.010 1.005 - 1.030   pH 6.0 5.0 - 8.0   Glucose, UA NEGATIVE NEGATIVE mg/dL   Hgb urine dipstick LARGE (A) NEGATIVE   Bilirubin Urine NEGATIVE NEGATIVE   Ketones, ur NEGATIVE NEGATIVE mg/dL   Protein, ur 30 (A)  NEGATIVE mg/dL   Nitrite POSITIVE (A) NEGATIVE   Leukocytes, UA LARGE (A) NEGATIVE  Urine microscopic-add on     Status: Abnormal   Collection Time: 05/29/16  4:11 PM  Result Value Ref Range   Squamous Epithelial / LPF 0-5 (A) NONE SEEN   WBC, UA TOO NUMEROUS TO COUNT 0 - 5 WBC/hpf   RBC / HPF 6-30 0 - 5 RBC/hpf   Bacteria, UA MANY (A) NONE SEEN  CBC with Differential/Platelet     Status: Abnormal   Collection Time: 05/29/16  4:25 PM  Result Value Ref Range   WBC 15.1 (H) 4.0 - 10.5 K/uL   RBC 4.21 3.87 - 5.11 MIL/uL   Hemoglobin 13.9 12.0 - 15.0 g/dL   HCT 91.4 78.2 - 95.6 %   MCV 97.6 78.0 - 100.0 fL   MCH 33.0 26.0 - 34.0 pg   MCHC 33.8 30.0 - 36.0 g/dL   RDW 21.3 08.6 - 57.8 %   Platelets 233 150 - 400 K/uL   Neutrophils Relative % 81 %   Neutro Abs 12.2 (H) 1.7 - 7.7 K/uL   Lymphocytes Relative 12 %   Lymphs Abs 1.9 0.7 - 4.0 K/uL   Monocytes Relative 7 %   Monocytes Absolute 1.0 0.1 - 1.0 K/uL   Eosinophils Relative 0 %   Eosinophils Absolute 0.0 0.0 - 0.7 K/uL   Basophils Relative 0 %   Basophils Absolute 0.0 0.0 - 0.1 K/uL  Basic metabolic panel     Status: Abnormal   Collection Time: 05/29/16  4:25 PM  Result Value Ref Range   Sodium 137 135 - 145 mmol/L   Potassium 3.4 (L) 3.5 - 5.1 mmol/L   Chloride 104 101 - 111 mmol/L   CO2 26 22 - 32 mmol/L   Glucose, Bld 99 65 - 99 mg/dL   BUN 12 6 - 20 mg/dL   Creatinine, Ser 4.69 0.44 - 1.00 mg/dL   Calcium 8.8 (L) 8.9 - 10.3 mg/dL   GFR calc non Af Amer >60 >60 mL/min   GFR calc Af Amer >60 >60 mL/min   Anion gap 7 5 - 15  Blood culture (routine x 2)     Status: None (Preliminary result)   Collection Time: 05/29/16  4:25 PM  Result Value Ref Range   Specimen Description BLOOD RIGHT ARM    Special Requests BOTTLES DRAWN AEROBIC AND ANAEROBIC 6CC    Culture PENDING    Report Status PENDING   Lactic acid, plasma     Status: None   Collection Time: 05/29/16  4:25 PM  Result Value Ref Range   Lactic Acid, Venous  1.1 0.5 - 1.9 mmol/L  Blood culture (routine x 2)     Status: None (Preliminary result)   Collection Time: 05/29/16  6:11 PM  Result Value Ref Range   Specimen Description BLOOD LEFT ARM    Special Requests BOTTLES DRAWN AEROBIC AND ANAEROBIC 6CC EACH    Culture PENDING    Report Status PENDING    Recent Results (from the past 240 hour(s))  Blood culture (routine x 2)     Status: None (Preliminary result)   Collection Time: 05/29/16  4:25 PM  Result Value Ref Range Status   Specimen Description BLOOD RIGHT ARM  Final   Special Requests BOTTLES DRAWN AEROBIC AND ANAEROBIC 6CC  Final   Culture PENDING  Incomplete   Report Status PENDING  Incomplete  Blood culture (routine x 2)     Status: None (Preliminary result)   Collection Time: 05/29/16  6:11 PM  Result Value Ref Range Status   Specimen Description BLOOD LEFT ARM  Final   Special Requests BOTTLES DRAWN AEROBIC AND ANAEROBIC [facility] EACH  Final   Culture PENDING  Incomplete   Report Status PENDING  Incomplete   Creatinine:  Recent Labs  05/29/16 1625  CREATININE 0.85    Impression/Assessment:  Right ureteral stone, sepsis -  Plan:  I discussed with patient and her family the patient the nature, potential benefits, risks and alternatives to cystoscopy, right ureteral stent placement, including side effects of the proposed treatment, the likelihood of the patient achieving the goals of the procedure, and any potential problems that might occur during the procedure or recuperation. All questions answered. Patient elects to proceed. We discussed rationale for a staged procedure, but importance of f/u as a stent is temporary. Greatly appreciate excellent hospitalist care for this sick patient.    Yetta Marceaux 05/29/2016, 9:08 PM

## 2016-05-29 NOTE — Transfer of Care (Signed)
Immediate Anesthesia Transfer of Care Note  Patient: Karla Scott  Procedure(s) Performed: Procedure(s): CYSTOSCOPY, RETROGRADE, STENT PLACEMENT (Right)  Patient Location: PACU  Anesthesia Type:General  Level of Consciousness: awake, alert , oriented and patient cooperative  Airway & Oxygen Therapy: Patient Spontanous Breathing and Patient connected to face mask oxygen  Post-op Assessment: Report given to RN, Post -op Vital signs reviewed and stable and Patient moving all extremities X 4  Post vital signs: stable  Last Vitals:  Vitals:   05/29/16 2018 05/29/16 2233  BP: (!) 120/57   Pulse: (!) 101   Resp: 18 (!) 25  Temp: (!) 38.2 C 37.7 C    Last Pain:  Vitals:   05/29/16 2233  TempSrc:   PainSc: 5          Complications: No apparent anesthesia complications

## 2016-05-29 NOTE — Anesthesia Preprocedure Evaluation (Signed)
Anesthesia Evaluation  Patient identified by MRN, date of birth, ID band Patient awake    Reviewed: Allergy & Precautions, NPO status , Patient's Chart, lab work & pertinent test results  Airway Mallampati: II  TM Distance: >3 FB Neck ROM: Full    Dental no notable dental hx.    Pulmonary neg pulmonary ROS, Current Smoker,    Pulmonary exam normal breath sounds clear to auscultation       Cardiovascular negative cardio ROS Normal cardiovascular exam Rhythm:Regular Rate:Normal     Neuro/Psych PSYCHIATRIC DISORDERS Anxiety Depression negative neurological ROS  negative psych ROS   GI/Hepatic negative GI ROS, Neg liver ROS,   Endo/Other  negative endocrine ROS  Renal/GU Renal diseasenegative Renal ROS     Musculoskeletal  (+) Arthritis , Fibromyalgia -  Abdominal   Peds  Hematology negative hematology ROS (+)   Anesthesia Other Findings   Reproductive/Obstetrics negative OB ROS                             Anesthesia Physical Anesthesia Plan  ASA: II and emergent  Anesthesia Plan: General   Post-op Pain Management:    Induction: Intravenous  Airway Management Planned: LMA  Additional Equipment:   Intra-op Plan:   Post-operative Plan: Extubation in OR  Informed Consent: I have reviewed the patients History and Physical, chart, labs and discussed the procedure including the risks, benefits and alternatives for the proposed anesthesia with the patient or authorized representative who has indicated his/her understanding and acceptance.   Dental advisory given  Plan Discussed with: CRNA  Anesthesia Plan Comments:         Anesthesia Quick Evaluation

## 2016-05-29 NOTE — H&P (Signed)
Patient Demographics:    Karla Scott, is a 50 y.o. female  MRN: [id number]   DOB - 03/04/1966  Admit Date - 05/29/2016  Out[facility] the [facility]   Assessment & Plan:    Principal Problem:   Sepsis (HCC) due to Urinary Source Active Problems:   Right nephrolithiasis   Hydronephrosis of right kidney With Infection   Anxiety/Depression   Tobacco use disorder    1)Sepsis secondary to urinary source- patient presents with fevers up to 101, tachycardia with heart rate in the low 100s, and leukocytosis with white count over 15,000 UA is very suggestive of a UTI in the setting of right-sided nephrolithiasis and right-sided hydronephrosis- patient received IV fluid boluses in the ED, lactic acid is not elevated, blood pressure is soft but not low. Treat empirically with IV Rocephin pending blood and urine cultures. Urology consult from Dr. Mena GoesEskridge appreciated, urologist has [facility] possible stone retrieval and stent placement overnight. When necessary opiates, antiemetics and antipyretics.   2)Complicated UTI- clinically patient appears septic, please see treatment plan as above in  #1 above. Patient with history of recurrent kidney stones, patient now has right-sided 5 mm kidney stone in the ureterovesical junction with moderate to severe right-sided hydronephrosis, neurology input appreciated please see #1 above. IV Rocephin pending blood and urine cultures  3)History of Anxiety/Depression- stable, continue trazodone,  Cymbalta and when necessary benzos   4)Tobacco use disorder- smoking cessation strongly advised  With History of - Reviewed by me  Past Medical History:  Diagnosis Date  . Abdominal pain   . Acute bronchitis   .  Acute upper respiratory infection   . Backache   . Cervicalgia   . Chest pain   . Chronic pain in left foot   . Costalchondritis   . CTS (carpal tunnel syndrome)   . Depressive disorder   . Dizziness   . Fatigue   . Fibromyalgia   . Hypercholesteremia   . Influenza   . Insomnia   . Joint pain   . Kidney stone   . Lateral epicondylitis of right elbow   . Nausea   . Palpitations   . Paroxysmal supraventricular tachycardia (HCC)   . Pleurisy   . Sinusitis   . Tobacco abuse   . Trigeminal neuralgia   . Ulnar neuropathy at elbow   . UTI (urinary tract infection)   . Viral labyrinthitis       Past Surgical History:  Procedure Laterality Date  . VAGINAL HYSTERECTOMY        Chief Complaint  Patient presents with  . Nephrolithiasis      HPI:    Karla Scott  is a 50 y.o. female, With past medical history relevant for personal history and family history of recurrent nephrolithiasis, patient previously had lithotripsy procedure, she now presents with a one-week history of right-sided flank pain which  is consistent with her usual recurrent kidney stones. However over the last 24 hours patient has developed fevers up to 101, and chills nausea and emesis. Emesis is without blood or bile. No diarrhea. No cough. No shortness of breath. Patient does complain of hematuria, dysuria, urinary frequency and urgency. Patient's sister is at bedside, questions answered. Patient has some palpitations and dizziness when standing up. No near syncope or falls  In ED patient was found to have fever, leukocytosis and tachycardia,  UA was strongly suggestive of UTI, CT abdomen and pelvis confirmed 5 x 5 mm ureterovesicular junction stone, with right-sided moderate to severe hydronephrosis. ED provider discussed this case with on-call neurologist Dr. Mena Goes who requested transfer to [facility], patient is for possible urological intervention including possible stone removal and stent  placement overnight. Patient received IV Rocephin in the ED blood and urine cultures were obtained in the ED    Review of systems:    In addition to the HPI above,   A full 12 point Review of 10 Systems was done, except as stated above, all other Review of 10 Systems were negative.    Social History:  Reviewed by me    Social History  Substance Use Topics  . Smoking status: Current Every Day Smoker    Packs/day: 0.50    Years: 30.00    Types: Cigarettes  . Smokeless tobacco: Never Used  . Alcohol use 0.0 oz/week     Comment: Socially       Family History :  Reviewed by me    Family History  Problem Relation Age of Onset  . Kidney disease Mother   . CVA Father   . Kidney Stones Sister   . Heart attack Sister   . Healthy Son      Home Medications:   Prior to Admission medications   Medication Sig Start Date End Date Taking? Authorizing Provider  ALPRAZolam (XANAX) 0.25 MG tablet Take 0.25 mg by mouth 2 (two) times daily as needed for anxiety.   Yes Historical Provider, MD  DULoxetine (CYMBALTA) 60 MG capsule Take 60 mg by mouth daily.   Yes Historical Provider, MD  Fish Oil-Cholecalciferol (FISH OIL + D3 PO) Take by mouth.   Yes Historical Provider, MD  HYDROcodone-acetaminophen (NORCO/VICODIN) 5-325 MG per tablet Take 1 tablet by mouth every 6 (six) hours as needed for moderate pain. 1 po qd and 2 po qhs   Yes Historical Provider, MD  traZODone (DESYREL) 150 MG tablet Take 150 mg by mouth at bedtime.    Yes Historical Provider, MD     Allergies:     Allergies  Allergen Reactions  . Ambien [Zolpidem Tartrate]   . Lipitor [Atorvastatin]   . Sulfa Antibiotics      Physical Exam:   Vitals  Blood pressure (!) 120/57, pulse (!) 101, temperature (!) 100.8 F (38.2 C), temperature source Oral, resp. rate 18, height 5' (1.524 m), weight 52.9 kg (116 lb 10 oz), SpO2 95 %.  Physical Examination: General appearance - alert, well appearing, and in no  distress Mental status - alert, oriented to person, place, and time,  Eyes - sclera anicteric Neck - supple, no JVD elevation , Chest - clear  to auscultation bilaterally, symmetrical air movement,  Heart - S1 and S2 normal,  Abdomen - soft, Right-sided abdominal, and right CVA area tenderness, nondistended, bowel sounds are good Neurological - screening mental status exam normal, neck supple without rigidity, cranial nerves II through XII  intact, DTR's normal and symmetric Extremities - no pedal edema noted, intact peripheral pulses  Skin - warm, dry    Data Review:    CBC  Recent Labs Lab 05/29/16 1625  WBC 15.1*  HGB 13.9  HCT 41.1  PLT 233  MCV 97.6  MCH 33.0  MCHC 33.8  RDW 12.6  LYMPHSABS 1.9  MONOABS 1.0  EOSABS 0.0  BASOSABS 0.0   ------------------------------------------------------------------------------------------------------------------  Chemistries   Recent Labs Lab 05/29/16 1625  NA 137  K 3.4*  CL 104  CO2 26  GLUCOSE 99  BUN 12  CREATININE 0.85  CALCIUM 8.8*   ------------------------------------------------------------------------------------------------------------------ estimated creatinine clearance is 56.9 mL/min (by C-G formula based on SCr of 0.85 mg/dL). ------------------------------------------------------------------------------------------------------------------ No results for input(s): TSH, T4TOTAL, T3FREE, THYROIDAB in the last 72 hours.  Invalid input(s): FREET3   Coagulation profile No results for input(s): INR, PROTIME in the last 168 hours. ------------------------------------------------------------------------------------------------------------------- No results for input(s): DDIMER in the last 72 hours. -------------------------------------------------------------------------------------------------------------------  Cardiac Enzymes No results for input(s): CKMB, TROPONINI, MYOGLOBIN in the last 168  hours.  Invalid input(s): CK ------------------------------------------------------------------------------------------------------------------ No results found for: BNP   ---------------------------------------------------------------------------------------------------------------  Urinalysis    Component Value Date/Time   COLORURINE YELLOW 05/29/2016 1611   APPEARANCEUR HAZY (A) 05/29/2016 1611   LABSPEC 1.010 05/29/2016 1611   PHURINE 6.0 05/29/2016 1611   GLUCOSEU NEGATIVE 05/29/2016 1611   HGBUR LARGE (A) 05/29/2016 1611   BILIRUBINUR NEGATIVE 05/29/2016 1611   KETONESUR NEGATIVE 05/29/2016 1611   PROTEINUR 30 (A) 05/29/2016 1611   NITRITE POSITIVE (A) 05/29/2016 1611   LEUKOCYTESUR LARGE (A) 05/29/2016 1611    ----------------------------------------------------------------------------------------------------------------   Imaging Results:    Ct Abdomen Pelvis Wo Contrast  Result Date: 05/29/2016 CLINICAL DATA:  Right flank pain for 1 week.  Hematuria. EXAM: CT ABDOMEN AND PELVIS WITHOUT CONTRAST TECHNIQUE: Multidetector CT imaging of the abdomen and pelvis was performed following the standard protocol without oral or intravenous contrast material administration. COMPARISON:  CT abdomen and pelvis June 19, 2015 FINDINGS: Lower chest: There is slight scarring in the lung bases. No lung base edema or consolidation. Hepatobiliary: No focal liver lesions are identified on this noncontrast enhanced study. Gallbladder wall is not appreciably thickened. There appears to be sludge within the gallbladder. There is no appreciable biliary duct dilatation. Pancreas: No pancreatic mass or inflammatory focus. Spleen: No splenic lesions are evident. Adrenals/Urinary Tract: Adrenals appear normal bilaterally. No renal mass is evident on either side. There is no hydronephrosis on the left. There is moderate to moderately severe hydronephrosis on the right. Right kidney is edematous. There is  a 1 mm calculus mid left kidney. There is a 1 mm calculus in the lower pole left kidney. There is no obstructing calculus on the left. On the right, there are several 1-2 mm upper pole calculi. There is a 1 mm calculus in the mid right kidney. There is a 1 mm calculus in the lower pole right kidney. There is a calculus in the distal right ureter measuring 5 x 5 mm which is located immediately proximal to the ureterovesical junction. No other ureteral calculi are identified on either side. There is ureterectasis on the right to the level of the distal calculus. The urinary bladder is decompressed. Urinary bladder wall thickness is felt to be within normal limits for the degree of urinary bladder decompression. Stomach/Bowel: There is no appreciable bowel wall or mesenteric thickening. No bowel obstruction. No free air or portal venous air. Vascular/Lymphatic: There are foci  of atherosclerotic calcification in the aorta and proximal right common iliac artery without demonstrable abdominal aortic aneurysm. The major mesenteric vessels appear patent on this noncontrast enhanced study. There is no adenopathy in the abdomen or pelvis. Reproductive: Uterus is absent. There is no pelvic mass or pelvic fluid collection. Other: Appendix is quite diminutive without evidence of inflammation. There is no abscess or ascites in the abdomen or pelvis. There is a rather minimal ventral hernia containing only fat. Musculoskeletal: There are no blastic or lytic bone lesions. There is no intramuscular or abdominal wall lesion. IMPRESSION: 5 x 5 mm calculus just proximal to the ureterovesical junction on the right causing moderate to moderately severe hydronephrosis and ureterectasis on the right. Right kidney is edematous. There are nonobstructing calculi in both kidneys, more on the right than on the left. No bowel obstruction. No abscess. No periappendiceal region inflammation. Uterus absent. Foci of aortic atherosclerosis. These  results will be called to the ordering clinician or representative by the Radiologist Assistant, and communication documented in the PACS or zVision Dashboard. Electronically Signed   By: Bretta Bang III M.D.   On: 05/29/2016 10:55    Radiological Exams on Admission: Ct Abdomen Pelvis Wo Contrast  Result Date: 05/29/2016 CLINICAL DATA:  Right flank pain for 1 week.  Hematuria. EXAM: CT ABDOMEN AND PELVIS WITHOUT CONTRAST TECHNIQUE: Multidetector CT imaging of the abdomen and pelvis was performed following the standard protocol without oral or intravenous contrast material administration. COMPARISON:  CT abdomen and pelvis June 19, 2015 FINDINGS: Lower chest: There is slight scarring in the lung bases. No lung base edema or consolidation. Hepatobiliary: No focal liver lesions are identified on this noncontrast enhanced study. Gallbladder wall is not appreciably thickened. There appears to be sludge within the gallbladder. There is no appreciable biliary duct dilatation. Pancreas: No pancreatic mass or inflammatory focus. Spleen: No splenic lesions are evident. Adrenals/Urinary Tract: Adrenals appear normal bilaterally. No renal mass is evident on either side. There is no hydronephrosis on the left. There is moderate to moderately severe hydronephrosis on the right. Right kidney is edematous. There is a 1 mm calculus mid left kidney. There is a 1 mm calculus in the lower pole left kidney. There is no obstructing calculus on the left. On the right, there are several 1-2 mm upper pole calculi. There is a 1 mm calculus in the mid right kidney. There is a 1 mm calculus in the lower pole right kidney. There is a calculus in the distal right ureter measuring 5 x 5 mm which is located immediately proximal to the ureterovesical junction. No other ureteral calculi are identified on either side. There is ureterectasis on the right to the level of the distal calculus. The urinary bladder is decompressed. Urinary  bladder wall thickness is felt to be within normal limits for the degree of urinary bladder decompression. Stomach/Bowel: There is no appreciable bowel wall or mesenteric thickening. No bowel obstruction. No free air or portal venous air. Vascular/Lymphatic: There are foci of atherosclerotic calcification in the aorta and proximal right common iliac artery without demonstrable abdominal aortic aneurysm. The major mesenteric vessels appear patent on this noncontrast enhanced study. There is no adenopathy in the abdomen or pelvis. Reproductive: Uterus is absent. There is no pelvic mass or pelvic fluid collection. Other: Appendix is quite diminutive without evidence of inflammation. There is no abscess or ascites in the abdomen or pelvis. There is a rather minimal ventral hernia containing only fat. Musculoskeletal: There are no  blastic or lytic bone lesions. There is no intramuscular or abdominal wall lesion. IMPRESSION: 5 x 5 mm calculus just proximal to the ureterovesical junction on the right causing moderate to moderately severe hydronephrosis and ureterectasis on the right. Right kidney is edematous. There are nonobstructing calculi in both kidneys, more on the right than on the left. No bowel obstruction. No abscess. No periappendiceal region inflammation. Uterus absent. Foci of aortic atherosclerosis. These results will be called to the ordering clinician or representative by the Radiologist Assistant, and communication documented in the PACS or zVision Dashboard. Electronically Signed   By: Bretta Bang III M.D.   On: 05/29/2016 10:55    DVT Prophylaxis -SCD   AM Labs Ordered, also please review Full Orders  Family Communication: Admission, patients condition and plan of care including tests being ordered have been discussed with the patient and sister at bedside who indicate understanding and agree with the plan   Code Status - Full Code  Likely DC to  home  Condition   fair  Mariea Clonts,  Taje Tondreau M.D on 05/29/2016 at 8:24 PM   Between 7am to 7pm - Pager - [phone]  After 7pm go to [email or web address] - password [email or web address]  [facility] - Office  [phone]  Dragon dictation system was used to create this note, attempts have been made to correct errors, however presence of uncorrected errors is not a reflection quality of care provided.

## 2016-05-29 NOTE — ED Triage Notes (Signed)
Pt reports she was sent for a CT scan which shows a blocking kidney stone. Pt sent for eval and intervention per her PCP.

## 2016-05-29 NOTE — ED Provider Notes (Signed)
[facility] DEPT Provider Note   CSN: [id number] Arrival date & time: 05/29/16  1559     History   Chief Complaint Chief Complaint  Patient presents with  . Nephrolithiasis    HPI Karla Scott is a 50 y.o. female.  Patient presents from PCPs office with CT scan showing ureteral stone. Reports she's had right-sided flank pain for the past one week that comes and goes. Episodes of nausea and vomiting overnight. She saw her PCP yesterday and was told that she had a urinary tract infection and was placed on Augmentin of which is only had one dose. CT scan today showed 5 mm right UVJ stone with hydronephrosis. She's had kidney stones before but her urologist was not available and she was sent to the ED. Denies fever. She has not had any vomiting since last night. She endorses dysuria and hematuria and small urinary amounts with frequency and urgency. She has had lithotripsy in the past but no surgery for kidney stones. She has been taking Vicodin as prescribed with her PCP with partial relief   The history is provided by the patient.    Past Medical History:  Diagnosis Date  . Abdominal pain   . Acute bronchitis   . Acute upper respiratory infection   . Backache   . Cervicalgia   . Chest pain   . Chronic pain in left foot   . Costalchondritis   . CTS (carpal tunnel syndrome)   . Depressive disorder   . Dizziness   . Fatigue   . Fibromyalgia   . Hypercholesteremia   . Influenza   . Insomnia   . Joint pain   . Kidney stone   . Lateral epicondylitis of right elbow   . Nausea   . Palpitations   . Paroxysmal supraventricular tachycardia (HCC)   . Pleurisy   . Sinusitis   . Tobacco abuse   . Trigeminal neuralgia   . Ulnar neuropathy at elbow   . UTI (urinary tract infection)   . Viral labyrinthitis     Patient Active Problem List   Diagnosis Date Noted  . Right trigeminal neuralgia 03/15/2015    Past Surgical History:  Procedure Laterality Date  . VAGINAL  HYSTERECTOMY      OB History    Gravida Para Term Preterm AB Living   3 2 2   1      SAB TAB Ectopic Multiple Live Births   1               Home Medications    Prior to Admission medications   Medication Sig Start Date End Date Taking? Authorizing Provider  ALPRAZolam (XANAX) 0.25 MG tablet Take 0.25 mg by mouth 2 (two) times daily as needed for anxiety.    Historical Provider, MD  atenolol (TENORMIN) 25 MG tablet Take by mouth daily.    Historical Provider, MD  desvenlafaxine (PRISTIQ) 50 MG 24 hr tablet Take 50 mg by mouth daily.    Historical Provider, MD  DULoxetine (CYMBALTA) 60 MG capsule Take 60 mg by mouth daily.    Historical Provider, MD  Fish Oil-Cholecalciferol (FISH OIL + D3 PO) Take by mouth.    Historical Provider, MD  HYDROcodone-acetaminophen (NORCO/VICODIN) 5-325 MG per tablet Take 1 tablet by mouth every 6 (six) hours as needed for moderate pain. 1 po qd and 2 po qhs    Historical Provider, MD  omeprazole (PRILOSEC) 20 MG capsule Take 20 mg by mouth daily.    Historical Provider,  MD  OXcarbazepine (TRILEPTAL) 150 MG tablet Take 1 tablet (150 mg total) by mouth 2 (two) times daily. 09/13/15   Donika K Patel, DO  tiZANidine (ZANAFLEX) 4 MG tablet Take 4 mg by mouth at bedtime.    Historical Provider, MD  traZODone (DESYREL) 100 MG tablet Take 100 mg by mouth at bedtime.    Historical Provider, MD    Family History Family History  Problem Relation Age of Onset  . Kidney disease Mother   . CVA Father   . Kidney Stones Sister   . Heart attack Sister   . Healthy Son     Social History Social History  Substance Use Topics  . Smoking status: Current Every Day Smoker    Packs/day: 0.50    Years: 30.00    Types: Cigarettes  . Smokeless tobacco: Never Used  . Alcohol use 0.0 oz/week     Comment: Socially     Allergies   Ambien [zolpidem tartrate]; Lipitor [atorvastatin]; and Sulfa antibiotics   Review of Systems Review of Systems  Constitutional:  Positive for chills. Negative for activity change, appetite change and fever.  HENT: Negative for congestion and rhinorrhea.   Eyes: Negative for visual disturbance.  Respiratory: Negative for cough, chest tightness and shortness of breath.   Cardiovascular: Negative for chest pain.  Gastrointestinal: Positive for abdominal pain, nausea and vomiting.  Genitourinary: Positive for difficulty urinating, dysuria, flank pain and hematuria. Negative for vaginal discharge and vaginal pain.  Musculoskeletal: Positive for back pain. Negative for neck pain and neck stiffness.  Neurological: Negative for dizziness, weakness and headaches.  A complete 10 system review of systems was obtained and all systems are negative except as noted in the HPI and PMH.     Physical Exam Updated Vital Signs BP (!) 111/41   Pulse 102   Temp 99.3 F (37.4 C) (Oral)   Resp 18   Ht 5' (1.524 m)   Wt 116 lb (52.6 kg)   SpO2 96%   BMI 22.65 kg/m   Physical Exam  Constitutional: She is oriented to person, place, and time. She appears well-developed and well-nourished. No distress.  HENT:  Head: Normocephalic and atraumatic.  Mouth/Throat: Oropharynx is clear and moist. No oropharyngeal exudate.  Eyes: Conjunctivae and EOM are normal. Pupils are equal, round, and reactive to light.  Neck: Normal range of motion. Neck supple.  No meningismus.  Cardiovascular: Normal rate, regular rhythm, normal heart sounds and intact distal pulses.   No murmur heard. Pulmonary/Chest: Effort normal and breath sounds normal. No respiratory distress.  Abdominal: Soft. There is no tenderness. There is no rebound and no guarding.  R sided abdominal pain, no guarding or rebound  Musculoskeletal: Normal range of motion. She exhibits tenderness. She exhibits no edema.  R paraspinal lumbar tenderness  Neurological: She is alert and oriented to person, place, and time. No cranial nerve deficit. She exhibits normal muscle tone.  Coordination normal.  No ataxia on finger to nose bilaterally. No pronator drift. 5/5 strength throughout. CN 2-12 intact.Equal grip strength. Sensation intact.   Skin: Skin is warm.  Psychiatric: She has a normal mood and affect. Her behavior is normal.  Nursing note and vitals reviewed.    ED Treatments / Results  Labs (all labs ordered are listed, but only abnormal results are displayed) Labs Reviewed  URINALYSIS, ROUTINE W REFLEX MICROSCOPIC (NOT AT [facility]) - Abnormal; Notable for the following:       Result Value   APPearance HAZY (*)  Hgb urine dipstick LARGE (*)    Protein, ur 30 (*)    Nitrite POSITIVE (*)    Leukocytes, UA LARGE (*)    All other components within normal limits  CBC WITH DIFFERENTIAL/PLATELET - Abnormal; Notable for the following:    WBC 15.1 (*)    Neutro Abs 12.2 (*)    All other components within normal limits  BASIC METABOLIC PANEL - Abnormal; Notable for the following:    Potassium 3.4 (*)    Calcium 8.8 (*)    All other components within normal limits  URINE MICROSCOPIC-ADD ON - Abnormal; Notable for the following:    Squamous Epithelial / LPF 0-5 (*)    Bacteria, UA MANY (*)    All other components within normal limits  CULTURE, BLOOD (ROUTINE X 2)  CULTURE, BLOOD (ROUTINE X 2)  URINE CULTURE  SURGICAL PCR SCREEN  LACTIC ACID, PLASMA  LACTIC ACID, PLASMA  BASIC METABOLIC PANEL  CBC  LACTIC ACID, PLASMA    EKG  EKG Interpretation None       Radiology Ct Abdomen Pelvis Wo Contrast  Result Date: 05/29/2016 CLINICAL DATA:  Right flank pain for 1 week.  Hematuria. EXAM: CT ABDOMEN AND PELVIS WITHOUT CONTRAST TECHNIQUE: Multidetector CT imaging of the abdomen and pelvis was performed following the standard protocol without oral or intravenous contrast material administration. COMPARISON:  CT abdomen and pelvis June 19, 2015 FINDINGS: Lower chest: There is slight scarring in the lung bases. No lung base edema or consolidation.  Hepatobiliary: No focal liver lesions are identified on this noncontrast enhanced study. Gallbladder wall is not appreciably thickened. There appears to be sludge within the gallbladder. There is no appreciable biliary duct dilatation. Pancreas: No pancreatic mass or inflammatory focus. Spleen: No splenic lesions are evident. Adrenals/Urinary Tract: Adrenals appear normal bilaterally. No renal mass is evident on either side. There is no hydronephrosis on the left. There is moderate to moderately severe hydronephrosis on the right. Right kidney is edematous. There is a 1 mm calculus mid left kidney. There is a 1 mm calculus in the lower pole left kidney. There is no obstructing calculus on the left. On the right, there are several 1-2 mm upper pole calculi. There is a 1 mm calculus in the mid right kidney. There is a 1 mm calculus in the lower pole right kidney. There is a calculus in the distal right ureter measuring 5 x 5 mm which is located immediately proximal to the ureterovesical junction. No other ureteral calculi are identified on either side. There is ureterectasis on the right to the level of the distal calculus. The urinary bladder is decompressed. Urinary bladder wall thickness is felt to be within normal limits for the degree of urinary bladder decompression. Stomach/Bowel: There is no appreciable bowel wall or mesenteric thickening. No bowel obstruction. No free air or portal venous air. Vascular/Lymphatic: There are foci of atherosclerotic calcification in the aorta and proximal right common iliac artery without demonstrable abdominal aortic aneurysm. The major mesenteric vessels appear patent on this noncontrast enhanced study. There is no adenopathy in the abdomen or pelvis. Reproductive: Uterus is absent. There is no pelvic mass or pelvic fluid collection. Other: Appendix is quite diminutive without evidence of inflammation. There is no abscess or ascites in the abdomen or pelvis. There is a rather  minimal ventral hernia containing only fat. Musculoskeletal: There are no blastic or lytic bone lesions. There is no intramuscular or abdominal wall lesion. IMPRESSION: 5 x 5 mm calculus  just proximal to the ureterovesical junction on the right causing moderate to moderately severe hydronephrosis and ureterectasis on the right. Right kidney is edematous. There are nonobstructing calculi in both kidneys, more on the right than on the left. No bowel obstruction. No abscess. No periappendiceal region inflammation. Uterus absent. Foci of aortic atherosclerosis. These results will be called to the ordering clinician or representative by the Radiologist Assistant, and communication documented in the PACS or zVision Dashboard. Electronically Signed   By: Bretta Bang III M.D.   On: 05/29/2016 10:55    Procedures Procedures (including critical care time)  Medications Ordered in ED Medications  sodium chloride 0.9 % bolus 1,000 mL (not administered)     Initial Impression / Assessment and Plan / ED Course  I have reviewed the triage vital signs and the nursing notes.  Pertinent labs & imaging results that were available during my care of the patient were reviewed by me and considered in my medical decision making (see chart for details).  Clinical Course   Right-sided flank pain for the past week, outpatient CT with 5 x 5 UVJ stone with obstruction and hydronephrosis. Patient denies fever. Was told by PCP she had a UTI which is concerning.  Patient appears comfortable and declines pain or nausea medication at this time.  Rectal temp 101.  WBC 15. Concern for infected stone. D/w urology. Blood and urine cultures obtained, IV Rocephin given  D/W Dr. Mena Goes. He states he will see the patient tonight at [facility] for stent placement. Recommends admission to hospitalist.  Admission to [facility] with Dr. Mariea Clonts. Patient remains hemodynamically stable. She refuses pain and  nausea medication.  CRITICAL CARE Performed by: Glynn Octave Total critical care time: 32 minutes Critical care time was exclusive of separately billable procedures and treating other patients. Critical care was necessary to treat or prevent imminent or life-threatening deterioration. Critical care was time spent personally by me on the following activities: development of treatment plan with patient and/or surrogate as well as nursing, discussions with consultants, evaluation of patient's response to treatment, examination of patient, obtaining history from patient or surrogate, ordering and performing treatments and interventions, ordering and review of laboratory studies, ordering and review of radiographic studies, pulse oximetry and re-evaluation of patient's condition.   Final Clinical Impressions(s) / ED Diagnoses   Final diagnoses:  Sepsis, due to unspecified organism [facility])  Ureteral stone    New Prescriptions New Prescriptions   No medications on file     Glynn Octave, MD 05/30/16 1610

## 2016-05-30 ENCOUNTER — Encounter (HOSPITAL_COMMUNITY): Payer: Self-pay | Admitting: Urology

## 2016-05-30 DIAGNOSIS — A419 Sepsis, unspecified organism: Secondary | ICD-10-CM

## 2016-05-30 DIAGNOSIS — E876 Hypokalemia: Secondary | ICD-10-CM

## 2016-05-30 DIAGNOSIS — N1330 Unspecified hydronephrosis: Secondary | ICD-10-CM

## 2016-05-30 LAB — BASIC METABOLIC PANEL
Anion gap: 4 — ABNORMAL LOW (ref 5–15)
BUN: 9 mg/dL (ref 6–20)
CALCIUM: 7.6 mg/dL — AB (ref 8.9–10.3)
CHLORIDE: 112 mmol/L — AB (ref 101–111)
CO2: 24 mmol/L (ref 22–32)
CREATININE: 0.84 mg/dL (ref 0.44–1.00)
GFR calc non Af Amer: >60 mL/min (ref >60)
Glucose, Bld: 133 mg/dL — ABNORMAL HIGH (ref 65–99)
Potassium: 4 mmol/L (ref 3.5–5.1)
SODIUM: 140 mmol/L (ref 135–145)

## 2016-05-30 LAB — CBC
HCT: 35.6 % — ABNORMAL LOW (ref 36.0–46.0)
Hemoglobin: 11.7 g/dL — ABNORMAL LOW (ref 12.0–15.0)
MCH: 32.7 pg (ref 26.0–34.0)
MCHC: 32.9 g/dL (ref 30.0–36.0)
MCV: 99.4 fL (ref 78.0–100.0)
Platelets: 201 10*3/uL (ref 150–400)
RBC: 3.58 MIL/uL — AB (ref 3.87–5.11)
RDW: 12.8 % (ref 11.5–15.5)
WBC: 8.7 10*3/uL (ref 4.0–10.5)

## 2016-05-30 LAB — LACTIC ACID, PLASMA: LACTIC ACID, VENOUS: 0.8 mmol/L (ref 0.5–1.9)

## 2016-05-30 LAB — SURGICAL PCR SCREEN
MRSA, PCR: NEGATIVE
Staphylococcus aureus: NEGATIVE

## 2016-05-30 MED ORDER — CIPROFLOXACIN HCL 500 MG PO TABS: 500.0000 mg | ORAL_TABLET | Freq: Two times a day (BID) | ORAL | 0 refills | Status: DC

## 2016-05-30 NOTE — Progress Notes (Signed)
1 Day Post-Op Subjective: Patient reports she's feeling much better. She voided without issue.   Objective: Vital signs in last 24 hours: Temp:  [98 F (36.7 C)-101 F (38.3 C)] 98 F (36.7 C) (09/29 0541) Pulse Rate:  [80-108] 106 (09/28 2320) Resp:  [14-25] 16 (09/29 0541) BP: (93-120)/(41-69) 109/48 (09/28 2320) SpO2:  [95 %-100 %] 97 % (09/29 0541) Weight:  [52.6 kg (116 lb)-52.9 kg (116 lb 10 oz)] 52.9 kg (116 lb 10 oz) (09/28 2018)  Intake/Output from previous day: 09/28 0701 - 09/29 0700 In: 5175 [P.O.:480; I.V.:3695; IV Piggyback:1000] Out: 650 [Urine:600; Blood:50] Intake/Output this shift: Total I/O In: -  Out: 300 [Urine:300]  Physical Exam:  NAD Sitting on edge of bed  Lab Results:  Recent Labs  05/29/16 1625 05/30/16 0125  HGB 13.9 11.7*  HCT 41.1 35.6*   BMET  Recent Labs  05/29/16 1625 05/30/16 0125  NA 137 140  K 3.4* 4.0  CL 104 112*  CO2 26 24  GLUCOSE 99 133*  BUN 12 9  CREATININE 0.85 0.84  CALCIUM 8.8* 7.6*   No results for input(s): LABPT, INR in the last 72 hours. No results for input(s): LABURIN in the last 72 hours. Results for orders placed or performed during the hospital encounter of 05/29/16  Blood culture (routine x 2)     Status: None (Preliminary result)   Collection Time: 05/29/16  4:25 PM  Result Value Ref Range Status   Specimen Description BLOOD RIGHT ARM  Final   Special Requests BOTTLES DRAWN AEROBIC AND ANAEROBIC 6CC  Final   Culture PENDING  Incomplete   Report Status PENDING  Incomplete  Blood culture (routine x 2)     Status: None (Preliminary result)   Collection Time: 05/29/16  6:11 PM  Result Value Ref Range Status   Specimen Description BLOOD LEFT ARM  Final   Special Requests BOTTLES DRAWN AEROBIC AND ANAEROBIC [facility]6CC EACH  Final   Culture PENDING  Incomplete   Report Status PENDING  Incomplete  Surgical PCR screen     Status: None   Collection Time: 05/29/16  8:30 PM  Result Value Ref Range Status    MRSA, PCR NEGATIVE NEGATIVE Final   Staphylococcus aureus NEGATIVE NEGATIVE Final    Comment:        The Xpert SA Assay (FDA approved for NASAL specimens in patients over 51 years of age), is one component of a comprehensive surveillance program.  Test performance has been validated by [facility] for patients greater than or equal to 51 year old. It is not intended to diagnose infection nor to guide or monitor treatment.     Studies/Results: Ct Abdomen Pelvis Wo Contrast  Result Date: 05/29/2016 CLINICAL DATA:  Right flank pain for 1 week.  Hematuria. EXAM: CT ABDOMEN AND PELVIS WITHOUT CONTRAST TECHNIQUE: Multidetector CT imaging of the abdomen and pelvis was performed following the standard protocol without oral or intravenous contrast material administration. COMPARISON:  CT abdomen and pelvis June 19, 2015 FINDINGS: Lower chest: There is slight scarring in the lung bases. No lung base edema or consolidation. Hepatobiliary: No focal liver lesions are identified on this noncontrast enhanced study. Gallbladder wall is not appreciably thickened. There appears to be sludge within the gallbladder. There is no appreciable biliary duct dilatation. Pancreas: No pancreatic mass or inflammatory focus. Spleen: No splenic lesions are evident. Adrenals/Urinary Tract: Adrenals appear normal bilaterally. No renal mass is evident on either side. There is no hydronephrosis on the left.  There is moderate to moderately severe hydronephrosis on the right. Right kidney is edematous. There is a 1 mm calculus mid left kidney. There is a 1 mm calculus in the lower pole left kidney. There is no obstructing calculus on the left. On the right, there are several 1-2 mm upper pole calculi. There is a 1 mm calculus in the mid right kidney. There is a 1 mm calculus in the lower pole right kidney. There is a calculus in the distal right ureter measuring 5 x 5 mm which is located immediately proximal to the ureterovesical  junction. No other ureteral calculi are identified on either side. There is ureterectasis on the right to the level of the distal calculus. The urinary bladder is decompressed. Urinary bladder wall thickness is felt to be within normal limits for the degree of urinary bladder decompression. Stomach/Bowel: There is no appreciable bowel wall or mesenteric thickening. No bowel obstruction. No free air or portal venous air. Vascular/Lymphatic: There are foci of atherosclerotic calcification in the aorta and proximal right common iliac artery without demonstrable abdominal aortic aneurysm. The major mesenteric vessels appear patent on this noncontrast enhanced study. There is no adenopathy in the abdomen or pelvis. Reproductive: Uterus is absent. There is no pelvic mass or pelvic fluid collection. Other: Appendix is quite diminutive without evidence of inflammation. There is no abscess or ascites in the abdomen or pelvis. There is a rather minimal ventral hernia containing only fat. Musculoskeletal: There are no blastic or lytic bone lesions. There is no intramuscular or abdominal wall lesion. IMPRESSION: 5 x 5 mm calculus just proximal to the ureterovesical junction on the right causing moderate to moderately severe hydronephrosis and ureterectasis on the right. Right kidney is edematous. There are nonobstructing calculi in both kidneys, more on the right than on the left. No bowel obstruction. No abscess. No periappendiceal region inflammation. Uterus absent. Foci of aortic atherosclerosis. These results will be called to the ordering clinician or representative by the Radiologist Assistant, and communication documented in the PACS or zVision Dashboard. Electronically Signed   By: Bretta Bang III M.D.   On: 05/29/2016 10:55    Assessment/Plan:  Sepsis -- blood and urine cx pending - appreciate hsopitalist care.   Right ureteral stone -- will set up for right ureteroscopy, laser lithotripsy in 2-3 weeks.    Will sign off. Page on call with any questions or concerns.    LOS: 1 day   Karla Scott 05/30/2016, 12:48 PM

## 2016-05-30 NOTE — Discharge Instructions (Addendum)
Ureteral Stent Implantation, Care After Refer to this sheet in the next few weeks. These instructions provide you with information on caring for yourself after your procedure. Your health care provider may also give you more specific instructions. Your treatment has been planned according to current medical practices, but problems sometimes occur. Call your health care provider if you have any problems or questions after your procedure. WHAT TO EXPECT AFTER THE PROCEDURE You should be back to normal activity within 48 hours after the procedure. Nausea and vomiting may occur and are commonly the result of anesthesia. It is common to experience sharp pain in the back or lower abdomen and penis with voiding. This is caused by movement of the ends of the stent with the act of urinating.It usually goes away within minutes after you have stopped urinating. HOME CARE INSTRUCTIONS Make sure to drink plenty of fluids. You may have small amounts of bleeding, causing your urine to be red. This is normal. Certain movements may trigger pain or a feeling that you need to urinate. You may be given medicines to prevent infection or bladder spasms. Be sure to take all medicines as directed. Only take over-the-counter or prescription medicines for pain, discomfort, or fever as directed by your health care provider. Do not take aspirin, as this can make bleeding worse.  Your stent will be left in until the blockage/stone is removed. This will take a few weeks.  Be sure to keep all follow-up appointments so your health care provider can plan stent removal/stone removal.   SEEK MEDICAL CARE IF:  You experience increasing pain.  Your pain medicine is not working. SEEK IMMEDIATE MEDICAL CARE IF:  Your urine is dark red or has blood clots.  You are leaking urine (incontinent).  You have a fever, chills, feeling sick to your stomach (nausea), or vomiting.  Your pain is not relieved by pain medicine.  The end of the  stent comes out of the urethra.  You are unable to urinate.   This information is not intended to replace advice given to you by your health care provider. Make sure you discuss any questions you have with your health care provider.   Document Released: 04/20/2013 Document Revised: 08/23/2013 Document Reviewed: 03/02/2015 Elsevier Interactive Patient Education [employer]2016 [employer]

## 2016-05-30 NOTE — Discharge Summary (Addendum)
Physician Discharge Summary  Karla Scott BHA:[id number] DOB: [birth year] DOA: 05/29/2016  PCP: Monico Blitz, MD  Admit date: 05/29/2016 Discharge date: 05/30/2016  Recommendations for Outpatient Follow-up:  Continue Cipro for 2 weeks on discharge  Follow up with urology per scheduled appt We will let you know what your urine and blood culture results are once the results are in  Discharge Diagnoses:  Principal Problem:   Sepsis ([place]) due to Urinary Source Active Problems:   Right nephrolithiasis   Hydronephrosis of right kidney With Infection   Anxiety/Depression   Tobacco use disorder    Discharge Condition: stable   Diet recommendation: as tolerated   History of present illness:   Per HPI "50 y.o. female, With past medical history relevant for personal history and family history of recurrent nephrolithiasis, patient previously had lithotripsy procedure, she now presents with a one-week history of right-sided flank pain which is consistent with her usual recurrent kidney stones. However over the last 24 hours patient has developed fevers up to 101, and chills nausea and emesis. Emesis is without blood or bile. No diarrhea. No cough. No shortness of breath. Patient does complain of hematuria, dysuria, urinary frequency and urgency. Patient's sister is at bedside, questions answered. Patient has some palpitations and dizziness when standing up. No near syncope or falls  In ED patient was found to have fever, leukocytosis and tachycardia,  UA was strongly suggestive of UTI, CT abdomen and pelvis confirmed 5 x 5 mm ureterovesicular junction stone, with right-sided moderate to severe hydronephrosis. ED provider discussed this case with on-call neurologist Dr. Junious Silk who requested transfer to [facility], patient is for possible urological intervention including possible stone removal and stent placement overnight. Patient received IV Rocephin in the ED blood and urine cultures were  obtained in the ED"   Hospital Course:   Sepsis secondary to E.Coli  UTI / right-sided nephrolithiasis and right side hydronephrosis - Sepsis criteria met on the admission with fever, tachycardia, leukocytosis, urinalysis suggestive of UTI in the setting of right-sided nephrolithiasis and right-sided hydronephrosis - Urine culture grew E.Coli  - Patient on IV Rocephin - Patient underwent cystoscopy with right retrograde pyelogram and right ureteral stent placement - Okay per urology the patient goes home today - Patient will continue ciprofloxacin for 2 weeks on discharge   History of Anxiety/Depression - Stable  - Continue home meds  Tobacco use disorder - Smoking cessation counseling provided     Signed:  Leisa Lenz, MD  [facility] 05/30/2016, 1:52 PM  Pager #: [phone]  Time spent in minutes: less than 30 minutes  Procedures:  Stent placement 9/28  Consultations:  GU  Discharge Exam: Vitals:   05/29/16 2320 05/30/16 0541  BP: (!) 109/48   Pulse: (!) 106   Resp: 20 16  Temp: 99 F (37.2 C) 98 F (36.7 C)   Vitals:   05/29/16 2300 05/29/16 2305 05/29/16 2320 05/30/16 0541  BP: (!) 101/55 (!) 99/53 (!) 109/48   Pulse: (!) 103 (!) 107 (!) 106   Resp: 18 (!) 23 20 16   Temp:  99.5 F (37.5 C) 99 F (37.2 C) 98 F (36.7 C)  TempSrc:   Oral Oral  SpO2: 96% 96% 97% 97%  Weight:      Height:        General: Pt is alert, follows commands appropriately, not in acute distress Cardiovascular: Regular rate and rhythm, S1/S2 +, no murmurs Respiratory: Clear to auscultation bilaterally, no wheezing, no crackles, no rhonchi Abdominal:  Soft, non tender, non distended, bowel sounds +, no guarding Extremities: no edema, no cyanosis, pulses palpable bilaterally DP and PT Neuro: Grossly nonfocal  Discharge Instructions  Discharge Instructions    Call MD for:  difficulty breathing, headache or visual disturbances    Complete by:  As directed     Call MD for:  redness, tenderness, or signs of infection (pain, swelling, redness, odor or green/yellow discharge around incision site)    Complete by:  As directed    Call MD for:  severe uncontrolled pain    Complete by:  As directed    Diet - low sodium heart healthy    Complete by:  As directed    Discharge instructions    Complete by:  As directed    Continue Cipro for 2 weeks on discharge  Follow up with urology per scheduled appt We will let you know what your urine and blood culture results are once the results are in   Increase activity slowly    Complete by:  As directed        Medication List    TAKE these medications   ALPRAZolam 0.25 MG tablet Commonly known as:  XANAX Take 0.25 mg by mouth 2 (two) times daily as needed for anxiety.   ciprofloxacin 500 MG tablet Commonly known as:  CIPRO Take 1 tablet (500 mg total) by mouth 2 (two) times daily.   DULoxetine 60 MG capsule Commonly known as:  CYMBALTA Take 60 mg by mouth daily.   FISH OIL + D3 PO Take by mouth.   HYDROcodone-acetaminophen 5-325 MG tablet Commonly known as:  NORCO/VICODIN Take 1 tablet by mouth every 6 (six) hours as needed for moderate pain. 1 po qd and 2 po qhs   traZODone 150 MG tablet Commonly known as:  DESYREL Take 150 mg by mouth at bedtime.      Follow-up Information    [name], MD.   Specialty:  Urology Why:  1-2 weeks  Contact information: [place] [address] [address]        Festus Aloe, MD.   Specialty:  Urology Why:  or see Dr. Junious Silk --- 1-2 weeks  Contact information: [address] [place] [address] [phone]        [facility], MD. Schedule an appointment as soon as possible for a visit in 2 week(s).   Specialty:  Internal Medicine Contact information: [place] [address] [phone]            The results of significant diagnostics from this hospitalization (including imaging, microbiology,  ancillary and laboratory) are listed below for reference.    Significant Diagnostic Studies: Ct Abdomen Pelvis Wo Contrast  Result Date: 05/29/2016 CLINICAL DATA:  Right flank pain for 1 week.  Hematuria. EXAM: CT ABDOMEN AND PELVIS WITHOUT CONTRAST TECHNIQUE: Multidetector CT imaging of the abdomen and pelvis was performed following the standard protocol without oral or intravenous contrast material administration. COMPARISON:  CT abdomen and pelvis June 19, 2015 FINDINGS: Lower chest: There is slight scarring in the lung bases. No lung base edema or consolidation. Hepatobiliary: No focal liver lesions are identified on this noncontrast enhanced study. Gallbladder wall is not appreciably thickened. There appears to be sludge within the gallbladder. There is no appreciable biliary duct dilatation. Pancreas: No pancreatic mass or inflammatory focus. Spleen: No splenic lesions are evident. Adrenals/Urinary Tract: Adrenals appear normal bilaterally. No renal mass is evident on either side. There is  no hydronephrosis on the left. There is moderate to moderately severe hydronephrosis on the right. Right kidney is edematous. There is a 1 mm calculus mid left kidney. There is a 1 mm calculus in the lower pole left kidney. There is no obstructing calculus on the left. On the right, there are several 1-2 mm upper pole calculi. There is a 1 mm calculus in the mid right kidney. There is a 1 mm calculus in the lower pole right kidney. There is a calculus in the distal right ureter measuring 5 x 5 mm which is located immediately proximal to the ureterovesical junction. No other ureteral calculi are identified on either side. There is ureterectasis on the right to the level of the distal calculus. The urinary bladder is decompressed. Urinary bladder wall thickness is felt to be within normal limits for the degree of urinary bladder decompression. Stomach/Bowel: There is no appreciable bowel wall or mesenteric thickening.  No bowel obstruction. No free air or portal venous air. Vascular/Lymphatic: There are foci of atherosclerotic calcification in the aorta and proximal right common iliac artery without demonstrable abdominal aortic aneurysm. The major mesenteric vessels appear patent on this noncontrast enhanced study. There is no adenopathy in the abdomen or pelvis. Reproductive: Uterus is absent. There is no pelvic mass or pelvic fluid collection. Other: Appendix is quite diminutive without evidence of inflammation. There is no abscess or ascites in the abdomen or pelvis. There is a rather minimal ventral hernia containing only fat. Musculoskeletal: There are no blastic or lytic bone lesions. There is no intramuscular or abdominal wall lesion. IMPRESSION: 5 x 5 mm calculus just proximal to the ureterovesical junction on the right causing moderate to moderately severe hydronephrosis and ureterectasis on the right. Right kidney is edematous. There are nonobstructing calculi in both kidneys, more on the right than on the left. No bowel obstruction. No abscess. No periappendiceal region inflammation. Uterus absent. Foci of aortic atherosclerosis. These results will be called to the ordering clinician or representative by the Radiologist Assistant, and communication documented in the PACS or zVision Dashboard. Electronically Signed   By: Lowella Grip III M.D.   On: 05/29/2016 10:55    Microbiology: Recent Results (from the past 240 hour(s))  Blood culture (routine x 2)     Status: None (Preliminary result)   Collection Time: 05/29/16  4:25 PM  Result Value Ref Range Status   Specimen Description BLOOD RIGHT ARM  Final   Special Requests BOTTLES DRAWN AEROBIC AND ANAEROBIC 6CC  Final   Culture PENDING  Incomplete   Report Status PENDING  Incomplete  Blood culture (routine x 2)     Status: None (Preliminary result)   Collection Time: 05/29/16  6:11 PM  Result Value Ref Range Status   Specimen Description BLOOD LEFT ARM   Final   Special Requests BOTTLES DRAWN AEROBIC AND ANAEROBIC [facility] EACH  Final   Culture PENDING  Incomplete   Report Status PENDING  Incomplete  Surgical PCR screen     Status: None   Collection Time: 05/29/16  8:30 PM  Result Value Ref Range Status   MRSA, PCR NEGATIVE NEGATIVE Final   Staphylococcus aureus NEGATIVE NEGATIVE Final    Comment:        The Xpert SA Assay (FDA approved for NASAL specimens in patients over 44 years of age), is one component of a comprehensive surveillance program.  Test performance has been validated by [facility] for patients greater than or equal to 77 year old.  It is not intended to diagnose infection nor to guide or monitor treatment.      Labs: Basic Metabolic Panel:  Recent Labs Lab 05/29/16 1625 05/30/16 0125  NA 137 140  K 3.4* 4.0  CL 104 112*  CO2 26 24  GLUCOSE 99 133*  BUN 12 9  CREATININE 0.85 0.84  CALCIUM 8.8* 7.6*   Liver Function Tests: No results for input(s): AST, ALT, ALKPHOS, BILITOT, PROT, ALBUMIN in the last 168 hours. No results for input(s): LIPASE, AMYLASE in the last 168 hours. No results for input(s): AMMONIA in the last 168 hours. CBC:  Recent Labs Lab 05/29/16 1625 05/30/16 0125  WBC 15.1* 8.7  NEUTROABS 12.2*  --   HGB 13.9 11.7*  HCT 41.1 35.6*  MCV 97.6 99.4  PLT 233 201   Cardiac Enzymes: No results for input(s): CKTOTAL, CKMB, CKMBINDEX, TROPONINI in the last 168 hours. BNP: BNP (last 3 results) No results for input(s): BNP in the last 8760 hours.  ProBNP (last 3 results) No results for input(s): PROBNP in the last 8760 hours.  CBG: No results for input(s): GLUCAP in the last 168 hours.

## 2016-06-01 LAB — URINE CULTURE

## 2016-06-02 NOTE — Anesthesia Postprocedure Evaluation (Signed)
Anesthesia Post Note  Patient: Karla Scott  Procedure(s) Performed: Procedure(s) (LRB): CYSTOSCOPY, RETROGRADE, STENT PLACEMENT (Right)  Patient location during evaluation: PACU Anesthesia Type: General Level of consciousness: sedated and patient cooperative Pain management: pain level controlled Vital Signs Assessment: post-procedure vital signs reviewed and stable Respiratory status: spontaneous breathing Cardiovascular status: stable Anesthetic complications: no    Last Vitals:  Vitals:   05/29/16 2320 05/30/16 0541  BP: (!) 109/48   Pulse: (!) 106   Resp: 20 16  Temp: 37.2 C 36.7 C    Last Pain:  Vitals:   05/30/16 0541  TempSrc: Oral  PainSc:                  Lewie LoronJohn Brynnleigh Mcelwee

## 2016-06-03 LAB — CULTURE, BLOOD (ROUTINE X 2)
Culture: NO GROWTH
Culture: NO GROWTH

## 2016-06-05 ENCOUNTER — Other Ambulatory Visit: Payer: Self-pay | Admitting: Urology

## 2016-06-06 ENCOUNTER — Encounter (HOSPITAL_BASED_OUTPATIENT_CLINIC_OR_DEPARTMENT_OTHER): Payer: Self-pay | Admitting: *Deleted

## 2016-06-06 NOTE — Progress Notes (Signed)
Orders for surgery on 06/10/16 need to be signed off in EPIC. Thank You

## 2016-06-09 NOTE — Progress Notes (Signed)
Need to sign off on ordersin EPIC.  Surgery on 06/10/16.  Thank You.

## 2016-06-09 NOTE — Anesthesia Preprocedure Evaluation (Addendum)
Anesthesia Evaluation  Patient identified by MRN, date of birth, ID band Patient awake    Reviewed: Allergy & Precautions, NPO status , Patient's Chart, lab work & pertinent test results  Airway Mallampati: II     Mouth opening: Limited Mouth Opening  Dental  (+) Teeth Intact, Dental Advisory Given   Pulmonary Current Smoker,    breath sounds clear to auscultation       Cardiovascular negative cardio ROS   Rhythm:Regular Rate:Normal     Neuro/Psych PSYCHIATRIC DISORDERS Anxiety Depression  Neuromuscular disease    GI/Hepatic negative GI ROS, Neg liver ROS,   Endo/Other  negative endocrine ROS  Renal/GU   negative genitourinary   Musculoskeletal  (+) Arthritis , Osteoarthritis,  Fibromyalgia -  Abdominal (+) - obese,   Peds negative pediatric ROS (+)  Hematology negative hematology ROS (+)   Anesthesia Other Findings   Reproductive/Obstetrics negative OB ROS                           Anesthesia Physical Anesthesia Plan  ASA: II  Anesthesia Plan: General   Post-op Pain Management:    Induction: Intravenous  Airway Management Planned:   Additional Equipment:   Intra-op Plan:   Post-operative Plan: Extubation in OR  Informed Consent: I have reviewed the patients History and Physical, chart, labs and discussed the procedure including the risks, benefits and alternatives for the proposed anesthesia with the patient or authorized representative who has indicated his/her understanding and acceptance.   Dental advisory given  Plan Discussed with:   Anesthesia Plan Comments:         Anesthesia Quick Evaluation

## 2016-06-10 ENCOUNTER — Ambulatory Visit (HOSPITAL_COMMUNITY): Payer: BLUE CROSS/BLUE SHIELD

## 2016-06-10 ENCOUNTER — Ambulatory Visit (HOSPITAL_COMMUNITY): Payer: BLUE CROSS/BLUE SHIELD | Admitting: Anesthesiology

## 2016-06-10 ENCOUNTER — Encounter (HOSPITAL_COMMUNITY): Payer: Self-pay

## 2016-06-10 ENCOUNTER — Ambulatory Visit (HOSPITAL_BASED_OUTPATIENT_CLINIC_OR_DEPARTMENT_OTHER)
Admission: RE | Admit: 2016-06-10 | Discharge: 2016-06-10 | Disposition: A | Discharge status: Home / Self Care | Payer: BLUE CROSS/BLUE SHIELD | Source: Ambulatory Visit | Attending: Urology | Admitting: Urology

## 2016-06-10 ENCOUNTER — Encounter (HOSPITAL_COMMUNITY)
Admission: RE | Disposition: A | Discharge status: Home / Self Care | Payer: Self-pay | Source: Ambulatory Visit | Attending: Urology

## 2016-06-10 DIAGNOSIS — I471 Supraventricular tachycardia: Secondary | ICD-10-CM | POA: Diagnosis not present

## 2016-06-10 DIAGNOSIS — N201 Calculus of ureter: Secondary | ICD-10-CM

## 2016-06-10 DIAGNOSIS — F419 Anxiety disorder, unspecified: Secondary | ICD-10-CM | POA: Diagnosis not present

## 2016-06-10 DIAGNOSIS — F17210 Nicotine dependence, cigarettes, uncomplicated: Secondary | ICD-10-CM | POA: Diagnosis not present

## 2016-06-10 DIAGNOSIS — G4700 Insomnia, unspecified: Secondary | ICD-10-CM | POA: Diagnosis not present

## 2016-06-10 DIAGNOSIS — E7800 Pure hypercholesterolemia, unspecified: Secondary | ICD-10-CM | POA: Insufficient documentation

## 2016-06-10 DIAGNOSIS — Z841 Family history of disorders of kidney and ureter: Secondary | ICD-10-CM | POA: Diagnosis not present

## 2016-06-10 DIAGNOSIS — M797 Fibromyalgia: Secondary | ICD-10-CM | POA: Diagnosis not present

## 2016-06-10 DIAGNOSIS — F329 Major depressive disorder, single episode, unspecified: Secondary | ICD-10-CM | POA: Insufficient documentation

## 2016-06-10 DIAGNOSIS — Z823 Family history of stroke: Secondary | ICD-10-CM | POA: Diagnosis not present

## 2016-06-10 HISTORY — PX: URETEROSCOPY WITH HOLMIUM LASER LITHOTRIPSY: SHX6645

## 2016-06-10 SURGERY — URETEROSCOPY, WITH LITHOTRIPSY USING HOLMIUM LASER
Anesthesia: General | Laterality: Right

## 2016-06-10 MED ORDER — FENTANYL CITRATE (PF) 100 MCG/2ML IJ SOLN
INTRAMUSCULAR | Status: DC | PRN
  Administered 2016-06-10 (×2): 25 ug via INTRAVENOUS
  Administered 2016-06-10: 50 ug via INTRAVENOUS

## 2016-06-10 MED ORDER — MIDAZOLAM HCL 2 MG/2ML IJ SOLN
INTRAMUSCULAR | Status: AC
  Filled 2016-06-10: qty 2

## 2016-06-10 MED ORDER — CEFAZOLIN SODIUM-DEXTROSE 2-4 GM/100ML-% IV SOLN
INTRAVENOUS | Status: AC
  Filled 2016-06-10: qty 100

## 2016-06-10 MED ORDER — 0.9 % SODIUM CHLORIDE (POUR BTL) OPTIME
TOPICAL | Status: DC | PRN
  Administered 2016-06-10: 1000 mL

## 2016-06-10 MED ORDER — LIDOCAINE 2% (20 MG/ML) 5 ML SYRINGE
INTRAMUSCULAR | Status: DC | PRN
  Administered 2016-06-10: 40 mg via INTRAVENOUS

## 2016-06-10 MED ORDER — ONDANSETRON HCL 4 MG/2ML IJ SOLN
INTRAMUSCULAR | Status: AC
  Filled 2016-06-10: qty 2

## 2016-06-10 MED ORDER — PROPOFOL 10 MG/ML IV BOLUS
INTRAVENOUS | Status: AC
  Filled 2016-06-10: qty 20

## 2016-06-10 MED ORDER — SODIUM CHLORIDE 0.9 % IR SOLN
Status: DC | PRN
  Administered 2016-06-10: 1000 mL

## 2016-06-10 MED ORDER — PROPOFOL 10 MG/ML IV BOLUS
INTRAVENOUS | Status: DC | PRN
  Administered 2016-06-10 (×2): 100 mg via INTRAVENOUS
  Administered 2016-06-10: 50 mg via INTRAVENOUS

## 2016-06-10 MED ORDER — CEFAZOLIN SODIUM-DEXTROSE 2-4 GM/100ML-% IV SOLN
2.0000 g | INTRAVENOUS | Status: AC
  Administered 2016-06-10: 2 g via INTRAVENOUS

## 2016-06-10 MED ORDER — CEPHALEXIN 500 MG PO CAPS: 500.0000 mg | ORAL_CAPSULE | Freq: Every day | ORAL | 0 refills | Status: DC

## 2016-06-10 MED ORDER — MIDAZOLAM HCL 5 MG/5ML IJ SOLN
INTRAMUSCULAR | Status: DC | PRN
  Administered 2016-06-10: 2 mg via INTRAVENOUS

## 2016-06-10 MED ORDER — LACTATED RINGERS IV SOLN
INTRAVENOUS | Status: DC | PRN
  Administered 2016-06-10: 07:00:00 via INTRAVENOUS

## 2016-06-10 MED ORDER — SODIUM CHLORIDE 0.9 % IR SOLN
Status: DC | PRN
  Administered 2016-06-10: 3000 mL

## 2016-06-10 MED ORDER — ONDANSETRON HCL 4 MG/2ML IJ SOLN
INTRAMUSCULAR | Status: DC | PRN
  Administered 2016-06-10: 4 mg via INTRAVENOUS

## 2016-06-10 MED ORDER — FENTANYL CITRATE (PF) 100 MCG/2ML IJ SOLN
INTRAMUSCULAR | Status: AC
  Filled 2016-06-10: qty 2

## 2016-06-10 MED ORDER — LIDOCAINE 2% (20 MG/ML) 5 ML SYRINGE
INTRAMUSCULAR | Status: AC
  Filled 2016-06-10: qty 5

## 2016-06-10 MED ORDER — DEXAMETHASONE SODIUM PHOSPHATE 10 MG/ML IJ SOLN
INTRAMUSCULAR | Status: AC
  Filled 2016-06-10: qty 1

## 2016-06-10 MED ORDER — DEXAMETHASONE SODIUM PHOSPHATE 10 MG/ML IJ SOLN
INTRAMUSCULAR | Status: DC | PRN
  Administered 2016-06-10: 10 mg via INTRAVENOUS

## 2016-06-10 SURGICAL SUPPLY — 20 items
BAG URO CATCHER STRL LF (MISCELLANEOUS) ×3 IMPLANT
BASKET ZERO TIP NITINOL 2.4FR (BASKET) ×3 IMPLANT
CATH INTERMIT  6FR 70CM (CATHETERS) ×3 IMPLANT
CATH URET 5FR 28IN CONE TIP (BALLOONS) ×2
CATH URET 5FR 70CM CONE TIP (BALLOONS) ×1 IMPLANT
CATH URET WHISTLE 6FR (CATHETERS) ×3 IMPLANT
CLOTH BEACON ORANGE TIMEOUT ST (SAFETY) ×3 IMPLANT
FIBER LASER TRAC TIP (UROLOGICAL SUPPLIES) ×3 IMPLANT
GLOVE BIO SURGEON STRL SZ7.5 (GLOVE) ×3 IMPLANT
GOWN STRL REUS W/TWL XL LVL3 (GOWN DISPOSABLE) ×3 IMPLANT
GUIDEWIRE ANG ZIPWIRE 038X150 (WIRE) ×3 IMPLANT
GUIDEWIRE STR DUAL SENSOR (WIRE) ×3 IMPLANT
MANIFOLD NEPTUNE II (INSTRUMENTS) ×3 IMPLANT
PACK CYSTO (CUSTOM PROCEDURE TRAY) ×3 IMPLANT
SHEATH ACCESS URETERAL 24CM (SHEATH) IMPLANT
SHEATH ACCESS URETERAL 38CM (SHEATH) ×3 IMPLANT
STENT URET 6FRX24 CONTOUR (STENTS) ×3 IMPLANT
TUBING CONNECTING 10 (TUBING) ×2 IMPLANT
TUBING CONNECTING 10' (TUBING) ×1
WIRE COONS/BENSON .038X145CM (WIRE) ×3 IMPLANT

## 2016-06-10 NOTE — Progress Notes (Signed)
Dr Mena GoesEskridge at bedside.  States pt does not need to void before discharge.

## 2016-06-10 NOTE — Anesthesia Postprocedure Evaluation (Signed)
Anesthesia Post Note  Patient: Amador Cunasrina Muzzy  Procedure(s) Performed: Procedure(s) (LRB): RIGHT URETEROSCOPY WITH HOLMIUM LASER LITHOTRIPSY AND STENT (Right)  Patient location during evaluation: PACU Anesthesia Type: General Level of consciousness: awake and alert Pain management: pain level controlled Vital Signs Assessment: post-procedure vital signs reviewed and stable Respiratory status: spontaneous breathing, nonlabored ventilation, respiratory function stable and patient connected to nasal cannula oxygen Cardiovascular status: blood pressure returned to baseline and stable Postop Assessment: no signs of nausea or vomiting Anesthetic complications: no    Last Vitals:  Vitals:   06/10/16 0913 06/10/16 0944  BP: (!) 107/48 (!) 114/44  Pulse:  89  Resp: 16 16  Temp: 36.6 C 36.7 C    Last Pain:  Vitals:   06/10/16 0944  TempSrc: Oral  PainSc:                  Shelton SilvasKevin D Dondi Burandt

## 2016-06-10 NOTE — Discharge Instructions (Signed)
Ureteral Stent Implantation, Care After Refer to this sheet in the next few weeks. These instructions provide you with information on caring for yourself after your procedure. Your health care provider may also give you more specific instructions. Your treatment has been planned according to current medical practices, but problems sometimes occur. Call your health care provider if you have any problems or questions after your procedure.  Remove the stent by pulling the string Monday morning, Jun 16, 2016,  Be sure to keep all follow-up appointments so your health care provider can check that you are healing properly.   WHAT TO EXPECT AFTER THE PROCEDURE You should be back to normal activity within 48 hours after the procedure. Nausea and vomiting may occur and are commonly the result of anesthesia. It is common to experience sharp pain in the back or lower abdomen and penis with voiding. This is caused by movement of the ends of the stent with the act of urinating.It usually goes away within minutes after you have stopped urinating. HOME CARE INSTRUCTIONS Make sure to drink plenty of fluids. You may have small amounts of bleeding, causing your urine to be red. This is normal. Certain movements may trigger pain or a feeling that you need to urinate. You may be given medicines to prevent infection or bladder spasms. Be sure to take all medicines as directed. Only take over-the-counter or prescription medicines for pain, discomfort, or fever as directed by your health care provider. Do not take aspirin, as this can make bleeding worse.  SEEK MEDICAL CARE IF:  You experience increasing pain.  Your pain medicine is not working. SEEK IMMEDIATE MEDICAL CARE IF:  Your urine is dark red or has blood clots.  You are leaking urine (incontinent).  You have a fever, chills, feeling sick to your stomach (nausea), or vomiting.  Your pain is not relieved by pain medicine.  The end of the stent comes out  of the urethra.  You are unable to urinate.   This information is not intended to replace advice given to you by your health care provider. Make sure you discuss any questions you have with your health care provider.   Document Released: 04/20/2013 Document Revised: 08/23/2013 Document Reviewed: 03/02/2015 Elsevier Interactive Patient Education 2016 Elsevier Inc[employer]   General Anesthesia, Adult, Care After Refer to this sheet in the next few weeks. These instructions provide you with information on caring for yourself after your procedure. Your health care provider may also give you more specific instructions. Your treatment has been planned according to current medical practices, but problems sometimes occur. Call your health care provider if you have any problems or questions after your procedure. WHAT TO EXPECT AFTER THE PROCEDURE After the procedure, it is typical to experience:  Sleepiness.  Nausea and vomiting. HOME CARE INSTRUCTIONS  For the first 24 hours after general anesthesia:  Have a responsible person with you.  Do not drive a car. If you are alone, do not take public transportation.  Do not drink alcohol.  Do not take medicine that has not been prescribed by your health care provider.  Do not sign important papers or make important decisions.  You may resume a normal diet and activities as directed by your health care provider.  Change bandages (dressings) as directed.  If you have questions or problems that seem related to general anesthesia, call the hospital and ask for the anesthetist or anesthesiologist on call. SEEK MEDICAL CARE IF:  You have nausea and vomiting  that continue the day after anesthesia.  You develop a rash. SEEK IMMEDIATE MEDICAL CARE IF:   You have difficulty breathing.  You have chest pain.  You have any allergic problems.   This information is not intended to replace advice given to you by your health care provider. Make sure you  discuss any questions you have with your health care provider.   Document Released: 11/24/2000 Document Revised: 09/08/2014 Document Reviewed: 12/17/2011 Elsevier Interactive Patient Education [employer] Elsevier Inc[employer]

## 2016-06-10 NOTE — Op Note (Signed)
Preoperative diagnosis: Right distal ureteral stone, right renal stones Postoperative diagnosis: Right distal ureteral stone  Procedure: Cystoscopy, right ureteroscopy with holmium laser lithotripsy, stone basket extraction and stent placement  Surgeon: Mena GoesEskridge  Anesthesia: Gen.  Indication for procedure [age] year old white female who underwent urgent right ureteral stent due to right distal ureteral stone and sepsis. She's been well and been on Cipro.  Findings: On cystoscopy the right ureteral stent was in place with no stone in the bladder. Ureteroscopy confirmed a right distal ureteral stone and no renal stones. There were calcification sub mucosal in the collecting ducts but no stones.  Description of procedure: After consent was obtained patient brought to the operating room. After adequate anesthesia she was placed in lithotomy position and prepped and draped in the usual sterile fashion. A timeout was performed to confirm the patient and procedure. The cystoscope was passed per urethra. The bladder was irrigated several times. The right ureteral stent grasped and removed through the urethral meatus. A sensor wire was advanced and cold in the collecting system under fluoroscopic guidance. The semirigid ureteroscope was passed and the stone was located just at the proximal end of the intramural ureter and somewhat buried into the wall. A 200  laser fiber was deployed and at a setting of 0.2 and 50 and 0.8 end 10 the stone was fragmented and the pieces tract and dropped in the bladder with a Nitinol basket. No other stone remained inspecting the distal and mid ureter. The wire was in the lumen. A  Glidewire was advanced through the scope and the scope removed. An access sheath was advanced  and the digital ureteroscope passed into the kidney and the calyces and renal pelvis inspected. I did not appreciate any stones in the kidney. The renal pelvis proximal ureter and the ureter was inspected as the  scope was brought out with the access sheath and the ureter was noted to be normal. There were no stones. The wire was backloaded on the cystoscope and a 6 x 24 cm stent was advanced. The wire was removed with a good coil seen in the kidney and a good coil in the bladder. The fragments were irrigated from the bladder. She was awakened and taken to the recovery room in stable condition.  Complications: None  Blood loss: Minimal  Specimens: Stone fragments to office lab  Drains: 6 x 24 cm stent on the right with a string  Disposition: Patient stable to PACU

## 2016-06-10 NOTE — H&P (Signed)
H&P  Chief Complaint: right distal ureteral and right renal   History of Present Illness: 50 yo WF s/p urgent stent for right distal stone and sepsis. She is well and continues abx. Presents today for right URS/HLL/stent.  Past Medical History:  Diagnosis Date  . Abdominal pain   . Acute bronchitis   . Acute upper respiratory infection   . Backache   . Cervicalgia   . Chest pain   . Chronic pain in left foot   . Costalchondritis   . CTS (carpal tunnel syndrome)   . Depressive disorder   . Dizziness   . Fatigue   . Fibromyalgia   . Hypercholesteremia   . Influenza   . Insomnia   . Joint pain   . Kidney stone   . Lateral epicondylitis of right elbow   . Nausea   . Palpitations   . Paroxysmal supraventricular tachycardia (HCC)   . Pleurisy   . Sinusitis   . Tobacco abuse   . Trigeminal neuralgia   . Ulnar neuropathy at elbow   . UTI (urinary tract infection)   . Viral labyrinthitis    Past Surgical History:  Procedure Laterality Date  . CYSTOSCOPY WITH STENT PLACEMENT Right 05/29/2016   Procedure: CYSTOSCOPY, RETROGRADE, STENT PLACEMENT;  Surgeon: Jerilee Field, MD;  Location: [facility];  Service: Urology;  Laterality: Right;  . LITHOTRIPSY    . VAGINAL HYSTERECTOMY      Home Medications:  Prescriptions Prior to Admission  Medication Sig Dispense Refill Last Dose  . ALPRAZolam (XANAX) 0.25 MG tablet Take 0.25 mg by mouth 2 (two) times daily as needed for anxiety.   06/09/2016 at pm  . cetirizine (ZYRTEC) 10 MG tablet Take 10 mg by mouth daily.   06/09/2016 at pm  . ciprofloxacin (CIPRO) 500 MG tablet Take 1 tablet (500 mg total) by mouth 2 (two) times daily. (Patient taking differently: Take 500 mg by mouth 2 (two) times daily. Has about a weeks worth left to take.) 28 tablet 0 06/09/2016 at pm  . DULoxetine (CYMBALTA) 60 MG capsule Take 60 mg by mouth daily.   06/10/2016 at 0430  . Fish Oil-Cholecalciferol (FISH OIL + D3 PO) Take 1 capsule by mouth daily.    Past Week  at Unknown time  . HYDROcodone-acetaminophen (NORCO/VICODIN) 5-325 MG per tablet Take 1 tablet by mouth every 6 (six) hours as needed for moderate pain.    06/10/2016 at eve  . ibuprofen (ADVIL,MOTRIN) 200 MG tablet Take 400 mg by mouth every 6 (six) hours as needed for mild pain.   Past Month at Unknown time  . Simethicone (GAS-X PO) Take by mouth as needed.   06/09/2016 at Unknown time  . traZODone (DESYREL) 150 MG tablet Take 150 mg by mouth at bedtime.    06/09/2016 at pm   Allergies:  Allergies  Allergen Reactions  . Ambien [Zolpidem Tartrate] Other (See Comments)    Bad dreams  . Lipitor [Atorvastatin] Other (See Comments)    Joint soreness  . Sulfa Antibiotics Hives    Family History  Problem Relation Age of Onset  . Kidney disease Mother   . CVA Father   . Kidney Stones Sister   . Heart attack Sister   . Healthy Son    Social History:  reports that she has been smoking Cigarettes.  She has a 15.00 pack-year smoking history. She has never used smokeless tobacco. She reports that she drinks alcohol. She reports that she does not use drugs.  ROS: A complete review of systems was performed.  All systems are negative except for pertinent findings as noted. ROS   Physical Exam:  Vital signs in last 24 hours: Temp:  [98.4 F (36.9 C)] 98.4 F (36.9 C) (10/10 0546) Pulse Rate:  [115] 115 (10/10 0546) Resp:  [16] 16 (10/10 0546) BP: (115)/(37) 115/37 (10/10 0546) SpO2:  [97 %] 97 % (10/10 0546) Weight:  [114 lb 9.6 oz (52 kg)] 114 lb 9.6 oz (52 kg) (10/10 0546) General:  Alert and oriented, No acute distress HEENT: Normocephalic, atraumatic Neck: No JVD or lymphadenopathy Cardiovascular: Regular rate and rhythm Lungs: Regular rate and effort Abdomen: Soft, nontender, nondistended, no abdominal masses Extremities: No edema Neurologic: Grossly intact  Laboratory Data:  No results found for this or any previous visit (from the past 24 hour(s)). No results found for this  or any previous visit (from the past 240 hour(s)). Creatinine: No results for input(s): CREATININE in the last 168 hours.  Impression/Assessment:  Right ureteral and renal stones -   Plan:  Right  URS/HLL/stent - she is on cipro and will get pre-op cefazolin. I discussed with her the nature, r/b/a to URS/HLL/stent placement including side effects of the proposed treatment, the likelihood of the patient achieving the goals of the procedure, and any potential problems that might occur during the procedure or recuperation. All questions answered. Patient elects to proceed.    Monterrius Cardosa 06/10/2016, 7:27 AM

## 2016-06-10 NOTE — Transfer of Care (Signed)
Immediate Anesthesia Transfer of Care Note  Patient: Karla Scott  Procedure(s) Performed: Procedure(s): RIGHT URETEROSCOPY WITH HOLMIUM LASER LITHOTRIPSY AND STENT (Right)  Patient Location: PACU  Anesthesia Type:General  Level of Consciousness:  sedated, patient cooperative and responds to stimulation  Airway & Oxygen Therapy:Patient Spontanous Breathing and Patient connected to face mask oxgen  Post-op Assessment:  Report given to PACU RN and Post -op Vital signs reviewed and stable  Post vital signs:  Reviewed and stable  Last Vitals:  Vitals:   06/10/16 0546  BP: (!) 115/37  Pulse: (!) 115  Resp: 16  Temp: 36.9 C    Complications: No apparent anesthesia complications

## 2016-06-10 NOTE — Anesthesia Procedure Notes (Signed)
Procedure Name: LMA Insertion Date/Time: 06/10/2016 7:58 AM Performed by: Jhonnie GarnerMARSHALL, Amena Dockham M Pre-anesthesia Checklist: Patient identified, Emergency Drugs available, Suction available and Patient being monitored Patient Re-evaluated:Patient Re-evaluated prior to inductionOxygen Delivery Method: Circle system utilized Preoxygenation: Pre-oxygenation with 100% oxygen Intubation Type: IV induction Ventilation: Mask ventilation without difficulty LMA: LMA inserted LMA Size: 3.0 Number of attempts: 1 Placement Confirmation: positive ETCO2 and breath sounds checked- equal and bilateral Tube secured with: Tape Dental Injury: Teeth and Oropharynx as per pre-operative assessment

## 2017-07-10 IMAGING — CT CT ABD-PELV W/O CM
2 of 4 series · 12 of 36 positions shown, 17 images · non-contrast
Comparison: CT abdomen and pelvis June 19, 2015

CLINICAL DATA: Right flank pain for 1 week.  Hematuria.

EXAM:
CT ABDOMEN AND PELVIS WITHOUT CONTRAST
TECHNIQUE: Multidetector CT imaging of the abdomen and pelvis was performed
following the standard protocol without oral or intravenous contrast
material administration.

[Series 601: coronal body · coronal · 0.84mm/px · 1 of 91 slices shown, 2 images]
[im 31/91  soft-tissue]
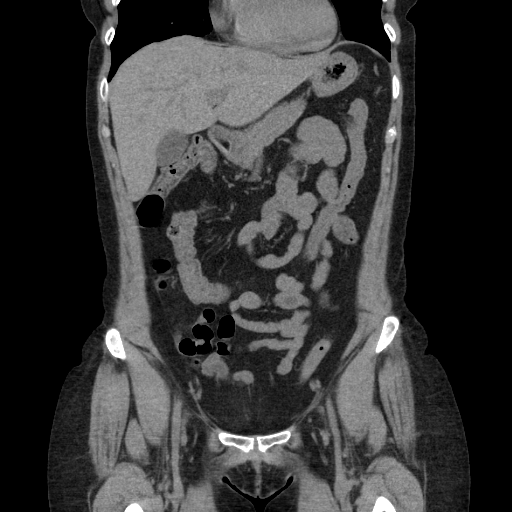
[im 31/91  bone]
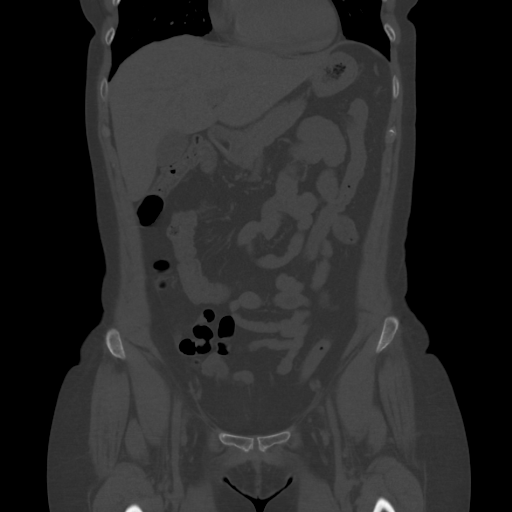

[Series 602: sagittal body · sagittal · 0.84mm/px · 11 of 149 slices shown, 15 images]
[im 9/149  soft-tissue]
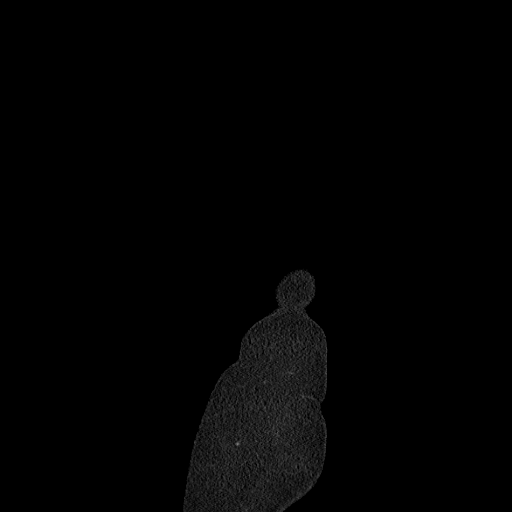
[im 9/149  lung]
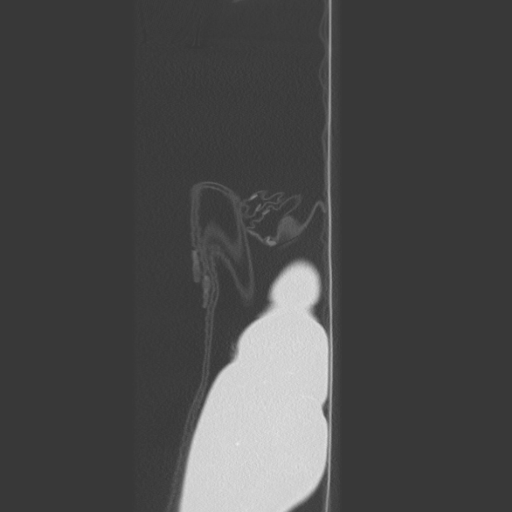
[im 9/149  bone]
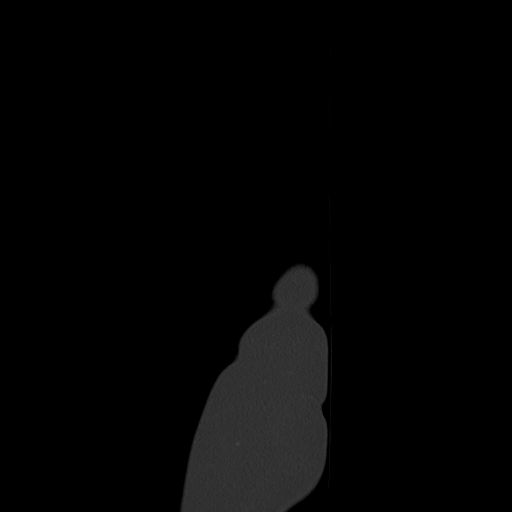
[im 17/149  lung]
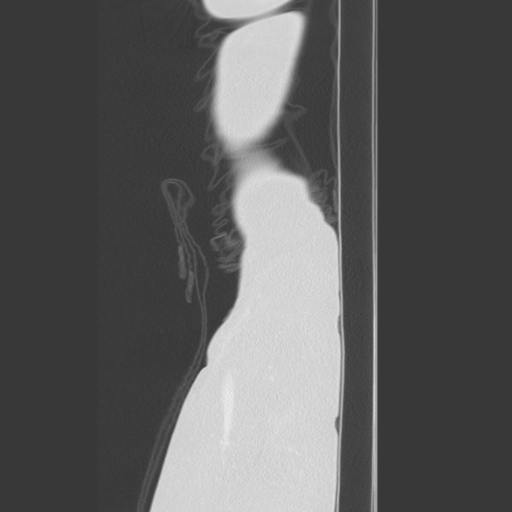
[im 25/149  soft-tissue]
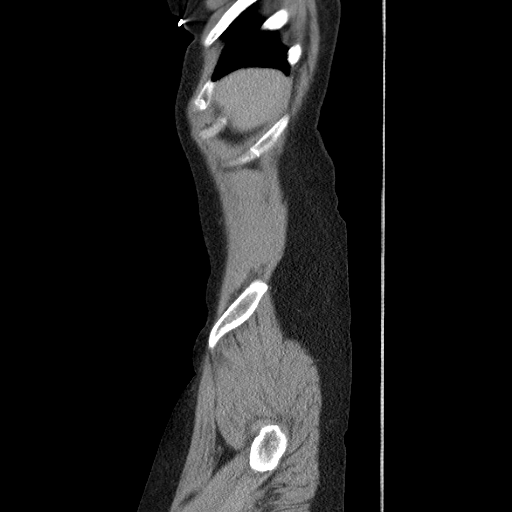
[im 25/149  lung]
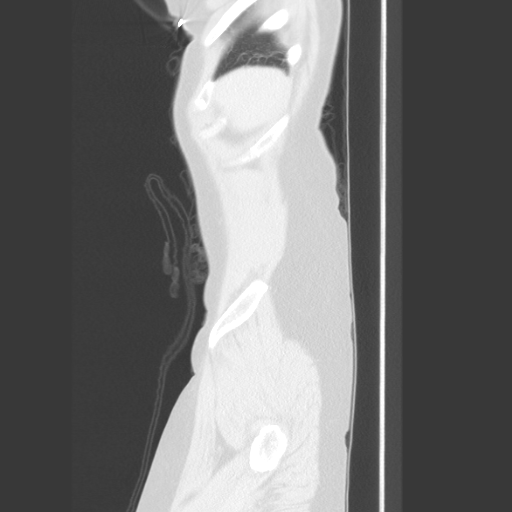
[im 33/149  lung]
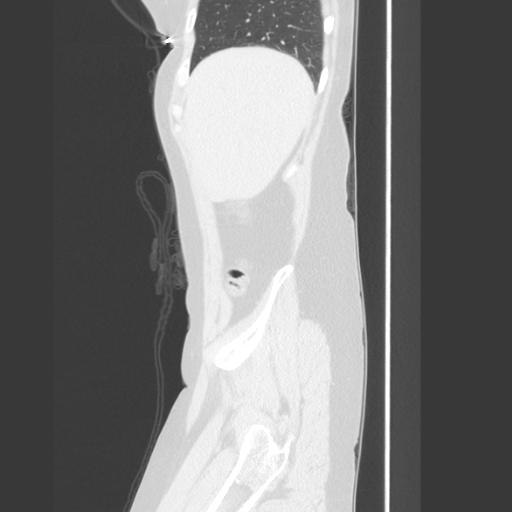
[im 42/149  soft-tissue]
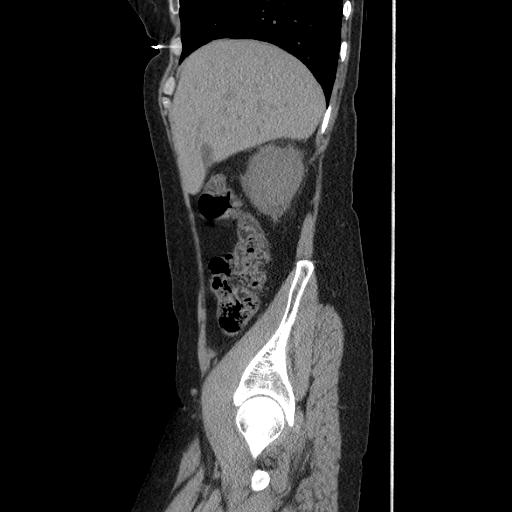
[im 58/149  soft-tissue]
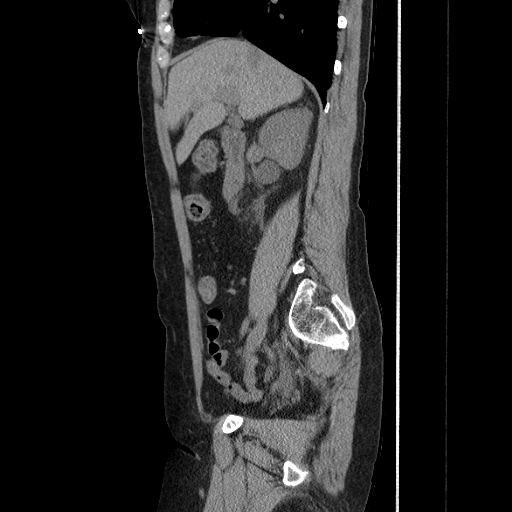
[im 75/149  soft-tissue]
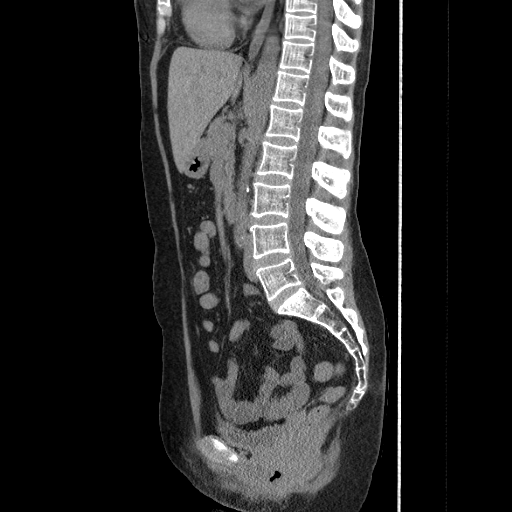
[im 91/149  soft-tissue]
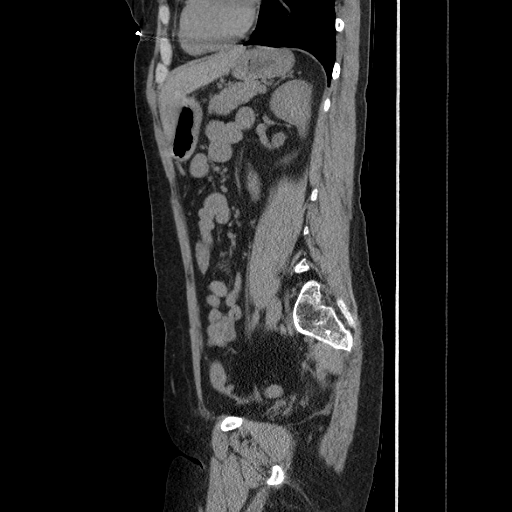
[im 107/149  soft-tissue]
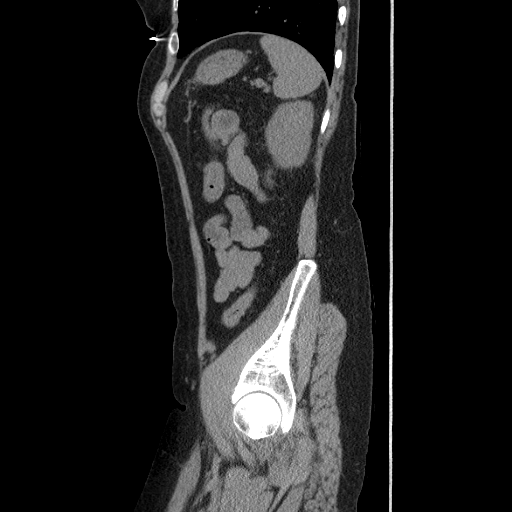
[im 124/149  soft-tissue]
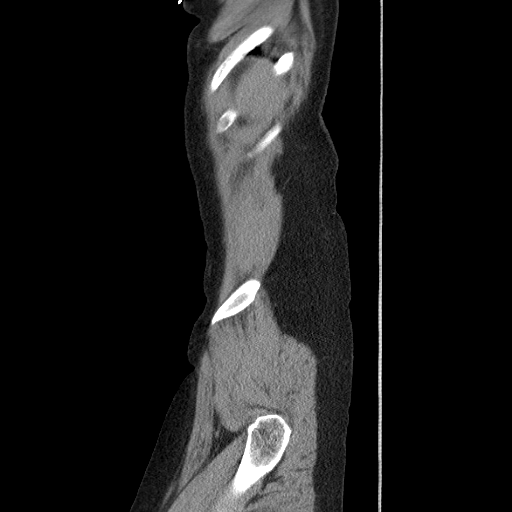
[im 140/149  soft-tissue]
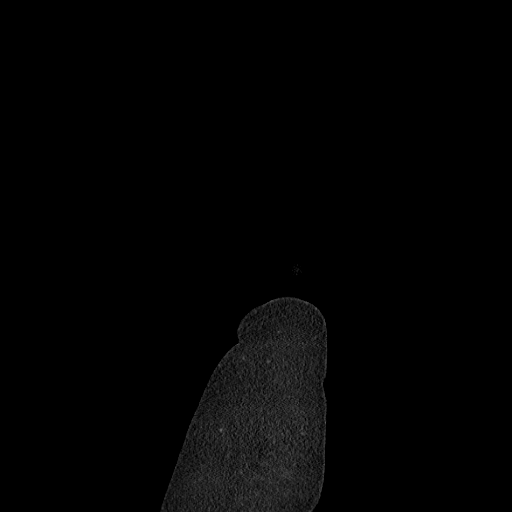
[im 140/149  bone]
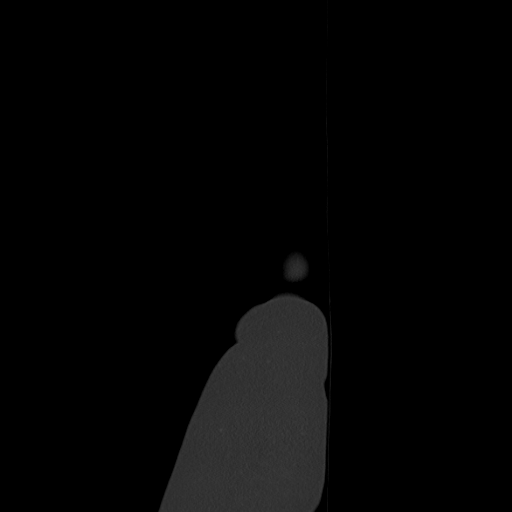

[12 of 36 positions shown; findings below may reference images not displayed]

FINDINGS: Lower chest: There is slight scarring in the lung bases. No lung
base edema or consolidation.

Hepatobiliary: No focal liver lesions are identified on this
noncontrast enhanced study. Gallbladder wall is not appreciably
thickened. There appears to be sludge within the gallbladder. There
is no appreciable biliary duct dilatation.

Pancreas: No pancreatic mass or inflammatory focus.

Spleen: No splenic lesions are evident.

Adrenals/Urinary Tract: Adrenals appear normal bilaterally. No renal
mass is evident on either side. There is no hydronephrosis on the
left. There is moderate to moderately severe hydronephrosis on the
right. Right kidney is edematous. There is a 1 mm calculus mid left
kidney. There is a 1 mm calculus in the lower pole left kidney.
There is no obstructing calculus on the left. On the right, there
are several 1-2 mm upper pole calculi. There is a 1 mm calculus in
the mid right kidney. There is a 1 mm calculus in the lower pole
right kidney. There is a calculus in the distal right ureter
measuring 5 x 5 mm which is located immediately proximal to the
ureterovesical junction. No other ureteral calculi are identified on
either side. There is ureterectasis on the right to the level of the
distal calculus. The urinary bladder is decompressed. Urinary
bladder wall thickness is felt to be within normal limits for the
degree of urinary bladder decompression.

Stomach/Bowel: There is no appreciable bowel wall or mesenteric
thickening. No bowel obstruction. No free air or portal venous air.

Vascular/Lymphatic: There are foci of atherosclerotic calcification
in the aorta and proximal right common iliac artery without
demonstrable abdominal aortic aneurysm. The major mesenteric vessels
appear patent on this noncontrast enhanced study. There is no
adenopathy in the abdomen or pelvis.

Reproductive: Uterus is absent. There is no pelvic mass or pelvic
fluid collection.

Other: Appendix is quite diminutive without evidence of
inflammation. There is no abscess or ascites in the abdomen or
pelvis. There is a rather minimal ventral hernia containing only
fat.

Musculoskeletal: There are no blastic or lytic bone lesions. There
is no intramuscular or abdominal wall lesion.
IMPRESSION: 5 x 5 mm calculus just proximal to the ureterovesical junction on
the right causing moderate to moderately severe hydronephrosis and
ureterectasis on the right. Right kidney is edematous.

There are nonobstructing calculi in both kidneys, more on the right
than on the left.

No bowel obstruction. No abscess. No periappendiceal region
inflammation.

Uterus absent.

Foci of aortic atherosclerosis.

These results will be called to the ordering clinician or
representative by the Radiologist Assistant, and communication
documented in the PACS or zVision Dashboard.

## 2018-01-06 ENCOUNTER — Ambulatory Visit (HOSPITAL_COMMUNITY): Payer: BLUE CROSS/BLUE SHIELD

## 2018-01-06 ENCOUNTER — Encounter (HOSPITAL_COMMUNITY)
Admission: AD | Disposition: A | Discharge status: Home / Self Care | Payer: Self-pay | Source: Other Acute Inpatient Hospital | Attending: Urology

## 2018-01-06 ENCOUNTER — Observation Stay (HOSPITAL_COMMUNITY)
Admission: AD | Admit: 2018-01-06 | Discharge: 2018-01-07 | Disposition: A | Discharge status: Home / Self Care | Payer: BLUE CROSS/BLUE SHIELD | Source: Other Acute Inpatient Hospital | Attending: Urology | Admitting: Urology

## 2018-01-06 ENCOUNTER — Encounter (HOSPITAL_COMMUNITY): Payer: Self-pay

## 2018-01-06 ENCOUNTER — Other Ambulatory Visit: Payer: Self-pay | Admitting: Urology

## 2018-01-06 ENCOUNTER — Other Ambulatory Visit: Payer: Self-pay

## 2018-01-06 ENCOUNTER — Ambulatory Visit (HOSPITAL_COMMUNITY): Payer: BLUE CROSS/BLUE SHIELD | Admitting: Anesthesiology

## 2018-01-06 DIAGNOSIS — F329 Major depressive disorder, single episode, unspecified: Secondary | ICD-10-CM | POA: Diagnosis not present

## 2018-01-06 DIAGNOSIS — N201 Calculus of ureter: Secondary | ICD-10-CM | POA: Diagnosis present

## 2018-01-06 DIAGNOSIS — N136 Pyonephrosis: Secondary | ICD-10-CM | POA: Diagnosis present

## 2018-01-06 DIAGNOSIS — M797 Fibromyalgia: Secondary | ICD-10-CM | POA: Diagnosis not present

## 2018-01-06 DIAGNOSIS — F419 Anxiety disorder, unspecified: Secondary | ICD-10-CM | POA: Insufficient documentation

## 2018-01-06 DIAGNOSIS — Z79891 Long term (current) use of opiate analgesic: Secondary | ICD-10-CM | POA: Insufficient documentation

## 2018-01-06 DIAGNOSIS — Z79899 Other long term (current) drug therapy: Secondary | ICD-10-CM | POA: Insufficient documentation

## 2018-01-06 DIAGNOSIS — F17210 Nicotine dependence, cigarettes, uncomplicated: Secondary | ICD-10-CM | POA: Diagnosis not present

## 2018-01-06 DIAGNOSIS — Z791 Long term (current) use of non-steroidal anti-inflammatories (NSAID): Secondary | ICD-10-CM | POA: Insufficient documentation

## 2018-01-06 HISTORY — PX: CYSTOSCOPY WITH STENT PLACEMENT: SHX5790

## 2018-01-06 LAB — CBC WITH DIFFERENTIAL/PLATELET
Basophils Absolute: 0 10*3/uL (ref 0.0–0.1)
Basophils Relative: 0 %
EOS PCT: 0 %
Eosinophils Absolute: 0 10*3/uL (ref 0.0–0.7)
HCT: 33.9 % — ABNORMAL LOW (ref 36.0–46.0)
Hemoglobin: 11.4 g/dL — ABNORMAL LOW (ref 12.0–15.0)
LYMPHS ABS: 1.1 10*3/uL (ref 0.7–4.0)
LYMPHS PCT: 9 %
MCH: 33.1 pg (ref 26.0–34.0)
MCHC: 33.6 g/dL (ref 30.0–36.0)
MCV: 98.5 fL (ref 78.0–100.0)
MONO ABS: 0.9 10*3/uL (ref 0.1–1.0)
MONOS PCT: 8 %
Neutro Abs: 9.5 10*3/uL — ABNORMAL HIGH (ref 1.7–7.7)
Neutrophils Relative %: 83 %
PLATELETS: 211 10*3/uL (ref 150–400)
RBC: 3.44 MIL/uL — ABNORMAL LOW (ref 3.87–5.11)
RDW: 12.3 % (ref 11.5–15.5)
WBC: 11.4 10*3/uL — ABNORMAL HIGH (ref 4.0–10.5)

## 2018-01-06 LAB — CBC
HEMATOCRIT: 41.3 % (ref 36.0–46.0)
HEMOGLOBIN: 14.3 g/dL (ref 12.0–15.0)
MCH: 34 pg (ref 26.0–34.0)
MCHC: 34.6 g/dL (ref 30.0–36.0)
MCV: 98.3 fL (ref 78.0–100.0)
Platelets: 246 10*3/uL (ref 150–400)
RBC: 4.2 MIL/uL (ref 3.87–5.11)
RDW: 12.3 % (ref 11.5–15.5)
WBC: 14.2 10*3/uL — AB (ref 4.0–10.5)

## 2018-01-06 LAB — BASIC METABOLIC PANEL
ANION GAP: 15 (ref 5–15)
BUN: 15 mg/dL (ref 6–20)
CHLORIDE: 99 mmol/L — AB (ref 101–111)
CO2: 24 mmol/L (ref 22–32)
Calcium: 8.9 mg/dL (ref 8.9–10.3)
Creatinine, Ser: 1 mg/dL (ref 0.44–1.00)
GFR calc Af Amer: >60 mL/min (ref >60)
GFR calc non Af Amer: >60 mL/min (ref >60)
Glucose, Bld: 108 mg/dL — ABNORMAL HIGH (ref 65–99)
POTASSIUM: 3.9 mmol/L (ref 3.5–5.1)
SODIUM: 138 mmol/L (ref 135–145)

## 2018-01-06 SURGERY — CYSTOSCOPY, WITH STENT INSERTION
Anesthesia: General | Laterality: Left

## 2018-01-06 MED ORDER — ALPRAZOLAM 0.25 MG PO TABS: 0.2500 mg | ORAL_TABLET | Freq: Two times a day (BID) | ORAL | Status: DC | PRN

## 2018-01-06 MED ORDER — ONDANSETRON HCL 4 MG/2ML IJ SOLN: 4.0000 mg | INTRAMUSCULAR | Status: DC | PRN

## 2018-01-06 MED ORDER — ZOLPIDEM TARTRATE 5 MG PO TABS: 5.0000 mg | ORAL_TABLET | Freq: Every evening | ORAL | Status: DC | PRN

## 2018-01-06 MED ORDER — SODIUM CHLORIDE 0.9 % IV SOLN
INTRAVENOUS | Status: AC
  Filled 2018-01-06: qty 10

## 2018-01-06 MED ORDER — SODIUM CHLORIDE 0.9 % IR SOLN
Status: DC | PRN
  Administered 2018-01-06: 3000 mL

## 2018-01-06 MED ORDER — DIPHENHYDRAMINE HCL 12.5 MG/5ML PO ELIX: 12.5000 mg | ORAL_SOLUTION | Freq: Four times a day (QID) | ORAL | Status: DC | PRN

## 2018-01-06 MED ORDER — PROMETHAZINE HCL 25 MG/ML IJ SOLN: 6.2500 mg | INTRAMUSCULAR | Status: DC | PRN

## 2018-01-06 MED ORDER — SODIUM CHLORIDE 0.9 % IV SOLN
INTRAVENOUS | Status: AC
  Administered 2018-01-06: 2 g via INTRAVENOUS
  Filled 2018-01-06: qty 20

## 2018-01-06 MED ORDER — HYDROMORPHONE HCL 1 MG/ML IJ SOLN: 0.2500 mg | INTRAMUSCULAR | Status: DC | PRN

## 2018-01-06 MED ORDER — SODIUM CHLORIDE 0.9 % IV SOLN
INTRAVENOUS | Status: DC
  Administered 2018-01-06 – 2018-01-07 (×2): via INTRAVENOUS

## 2018-01-06 MED ORDER — DOCUSATE SODIUM 100 MG PO CAPS: 100.0000 mg | ORAL_CAPSULE | Freq: Every day | ORAL | Status: DC

## 2018-01-06 MED ORDER — DULOXETINE HCL 60 MG PO CPEP
60.0000 mg | ORAL_CAPSULE | Freq: Every day | ORAL | Status: DC
  Administered 2018-01-07: 60 mg via ORAL
  Filled 2018-01-06: qty 1

## 2018-01-06 MED ORDER — ONDANSETRON HCL 4 MG/2ML IJ SOLN
INTRAMUSCULAR | Status: AC
  Filled 2018-01-06: qty 2

## 2018-01-06 MED ORDER — CEFAZOLIN SODIUM-DEXTROSE 2-4 GM/100ML-% IV SOLN
2.0000 g | Freq: Once | INTRAVENOUS | Status: AC
  Administered 2018-01-06: 2 g via INTRAVENOUS

## 2018-01-06 MED ORDER — DOCUSATE SODIUM 100 MG PO CAPS
100.0000 mg | ORAL_CAPSULE | Freq: Two times a day (BID) | ORAL | Status: DC
  Administered 2018-01-06 – 2018-01-07 (×2): 100 mg via ORAL
  Filled 2018-01-06 (×2): qty 1

## 2018-01-06 MED ORDER — LORATADINE 10 MG PO TABS
10.0000 mg | ORAL_TABLET | Freq: Every day | ORAL | Status: DC
  Administered 2018-01-07: 10 mg via ORAL
  Filled 2018-01-06: qty 1

## 2018-01-06 MED ORDER — ACETAMINOPHEN 10 MG/ML IV SOLN
1000.0000 mg | Freq: Once | INTRAVENOUS | Status: DC | PRN
  Administered 2018-01-06: 1000 mg via INTRAVENOUS

## 2018-01-06 MED ORDER — TRAZODONE HCL 50 MG PO TABS
150.0000 mg | ORAL_TABLET | Freq: Every day | ORAL | Status: DC
  Administered 2018-01-06: 150 mg via ORAL
  Filled 2018-01-06: qty 1

## 2018-01-06 MED ORDER — DEXAMETHASONE SODIUM PHOSPHATE 10 MG/ML IJ SOLN
INTRAMUSCULAR | Status: DC | PRN
  Administered 2018-01-06: 10 mg via INTRAVENOUS

## 2018-01-06 MED ORDER — ONDANSETRON HCL 4 MG/2ML IJ SOLN
INTRAMUSCULAR | Status: DC | PRN
Start: 2018-01-06 — End: 2018-01-06
  Administered 2018-01-06: 4 mg via INTRAVENOUS

## 2018-01-06 MED ORDER — CEFAZOLIN SODIUM-DEXTROSE 2-4 GM/100ML-% IV SOLN
INTRAVENOUS | Status: AC
  Filled 2018-01-06: qty 100

## 2018-01-06 MED ORDER — DIPHENHYDRAMINE HCL 50 MG/ML IJ SOLN: 12.5000 mg | Freq: Four times a day (QID) | INTRAMUSCULAR | Status: DC | PRN

## 2018-01-06 MED ORDER — LACTATED RINGERS IV SOLN
INTRAVENOUS | Status: DC
  Administered 2018-01-06 (×2): via INTRAVENOUS

## 2018-01-06 MED ORDER — PROPOFOL 10 MG/ML IV BOLUS
INTRAVENOUS | Status: AC
  Filled 2018-01-06: qty 20

## 2018-01-06 MED ORDER — HYDROCODONE-ACETAMINOPHEN 7.5-325 MG PO TABS: 1.0000 | ORAL_TABLET | Freq: Once | ORAL | Status: DC | PRN

## 2018-01-06 MED ORDER — CEFTRIAXONE SODIUM 1 G IJ SOLR
INTRAMUSCULAR | Status: AC
  Filled 2018-01-06: qty 10

## 2018-01-06 MED ORDER — MIDAZOLAM HCL 2 MG/2ML IJ SOLN
INTRAMUSCULAR | Status: AC
  Filled 2018-01-06: qty 2

## 2018-01-06 MED ORDER — 0.9 % SODIUM CHLORIDE (POUR BTL) OPTIME
TOPICAL | Status: DC | PRN
  Administered 2018-01-06: 1000 mL

## 2018-01-06 MED ORDER — MIDAZOLAM HCL 5 MG/5ML IJ SOLN
INTRAMUSCULAR | Status: DC | PRN
  Administered 2018-01-06: 2 mg via INTRAVENOUS

## 2018-01-06 MED ORDER — SODIUM CHLORIDE 0.9 % IV SOLN
2.0000 g | INTRAVENOUS | Status: DC
  Administered 2018-01-06: 2 g via INTRAVENOUS
  Filled 2018-01-06: qty 20

## 2018-01-06 MED ORDER — DEXAMETHASONE SODIUM PHOSPHATE 10 MG/ML IJ SOLN
INTRAMUSCULAR | Status: AC
  Filled 2018-01-06: qty 1

## 2018-01-06 MED ORDER — LIDOCAINE 2% (20 MG/ML) 5 ML SYRINGE
INTRAMUSCULAR | Status: AC
  Filled 2018-01-06: qty 5

## 2018-01-06 MED ORDER — HYDROCODONE-ACETAMINOPHEN 5-325 MG PO TABS: 1.0000 | ORAL_TABLET | ORAL | Status: DC | PRN

## 2018-01-06 MED ORDER — PROPOFOL 10 MG/ML IV BOLUS
INTRAVENOUS | Status: DC | PRN
  Administered 2018-01-06: 140 mg via INTRAVENOUS

## 2018-01-06 MED ORDER — MEPERIDINE HCL 50 MG/ML IJ SOLN: 6.2500 mg | INTRAMUSCULAR | Status: DC | PRN

## 2018-01-06 MED ORDER — ACETAMINOPHEN 10 MG/ML IV SOLN
INTRAVENOUS | Status: AC
  Filled 2018-01-06: qty 100

## 2018-01-06 MED ORDER — FENTANYL CITRATE (PF) 100 MCG/2ML IJ SOLN
INTRAMUSCULAR | Status: DC | PRN
  Administered 2018-01-06 (×2): 50 ug via INTRAVENOUS

## 2018-01-06 MED ORDER — LIDOCAINE 2% (20 MG/ML) 5 ML SYRINGE
INTRAMUSCULAR | Status: DC | PRN
  Administered 2018-01-06: 100 mg via INTRAVENOUS

## 2018-01-06 MED ORDER — FENTANYL CITRATE (PF) 100 MCG/2ML IJ SOLN
INTRAMUSCULAR | Status: AC
  Filled 2018-01-06: qty 2

## 2018-01-06 SURGICAL SUPPLY — 13 items
BAG URO CATCHER STRL LF (MISCELLANEOUS) ×3 IMPLANT
CATH INTERMIT  6FR 70CM (CATHETERS) ×3 IMPLANT
CLOTH BEACON ORANGE TIMEOUT ST (SAFETY) ×3 IMPLANT
COVER FOOTSWITCH UNIV (MISCELLANEOUS) IMPLANT
COVER SURGICAL LIGHT HANDLE (MISCELLANEOUS) ×3 IMPLANT
GLOVE BIOGEL M STRL SZ7.5 (GLOVE) ×3 IMPLANT
GOWN STRL REUS W/TWL LRG LVL3 (GOWN DISPOSABLE) ×3 IMPLANT
GUIDEWIRE STR DUAL SENSOR (WIRE) ×3 IMPLANT
MANIFOLD NEPTUNE II (INSTRUMENTS) ×3 IMPLANT
PACK CYSTO (CUSTOM PROCEDURE TRAY) ×3 IMPLANT
STENT URET 6FRX24 CONTOUR (STENTS) ×3 IMPLANT
TUBING CONNECTING 10 (TUBING) ×2 IMPLANT
TUBING CONNECTING 10' (TUBING) ×1

## 2018-01-06 NOTE — Anesthesia Procedure Notes (Signed)
Procedure Name: LMA Insertion Date/Time: 01/06/2018 4:45 PM Performed by: Epimenio Sarin, CRNA Pre-anesthesia Checklist: Patient identified, Emergency Drugs available, Suction available, Patient being monitored and Timeout performed Patient Re-evaluated:Patient Re-evaluated prior to induction Oxygen Delivery Method: Circle system utilized Preoxygenation: Pre-oxygenation with 100% oxygen Induction Type: IV induction LMA: LMA with gastric port inserted LMA Size: 3.0 Number of attempts: 1 Dental Injury: Teeth and Oropharynx as per pre-operative assessment

## 2018-01-06 NOTE — H&P (Signed)
Office Visit Report     01/06/2018   --------------------------------------------------------------------------------   Karla Scott  MRN: [id number]  PRIMARY CARE:  Ashish C. Sherryll Burger, MD  DOB: [birth year], 52 year old Female  REFERRING:  Ashish C. Sherryll Burger, MD  SSN:-[id number]  PROVIDER:  Jerilee Field, M.D.    TREATING:  Jetta Lout    LOCATION:  [facility] [phone]   --------------------------------------------------------------------------------   CC: I have pain in the flank.  HPI: Karla Scott is a 52 year-old female established patient who is here for flank pain.  Last pm temp 103 and this am 101.5. Has been self treating fever with PRN Tylenol/NSAID and currently afebrile.    The problem is on the left side. Her pain started about approximately 01/01/2018. The pain is sharp. The pain is intermittent. The pain does radiate.   None< makes the pain better. Nothing causes the pain to become worse. She was treated with the following pain medication(s): Hydrocodone, Tylenol, and Ibuprofen.   She has had this same pain previously. She has had kidney stones.     ALLERGIES: Sulfa - Hives    MEDICATIONS: Zyrtec  Cymbalta 60 mg capsule,delayed release  Hydrocodone-Acetaminophen 2.5 mg-325 mg tablet 1 tablet PO Q 6 H PRN  Ibuprofen  Stool Softener 1 PO Daily PRN  Trazodone Hcl 150 mg tablet  Xanax 0.25 mg tablet     GU PSH: Cystoscopy Insert Stent, Right - 05/29/2016 Ureteroscopic laser litho, Right - 06/10/2016    NON-GU PSH: Treat Trigeminal Nerve    GU PMH: Flank Pain - 02/19/2017 Renal calculus - 02/19/2017 Ureteral calculus - 07/30/2016    NON-GU PMH: Anxiety Arthritis Depression    FAMILY HISTORY: 2 sons - Son Kidney Stones - Runs in Family stroke - Father   SOCIAL HISTORY: Marital Status: Married Preferred Language: English; Ethnicity: Not Hispanic Or Latino; Race: White Current Smoking Status: Patient smokes. Has smoked since  12/30/1997. Smokes 1/2 pack per day.   Tobacco Use Assessment Completed: Used Tobacco in last 30 days? Does not use smokeless tobacco. Types of alcohol consumed: Wine. Social Drinker.  Does not use drugs. Drinks 1 caffeinated drink per day. Has not had a blood transfusion. Patient's occupation Community education officer.    REVIEW OF SYSTEMS:    GU Review Female:   Patient reports frequent urination. Patient denies hard to postpone urination, burning /pain with urination, get up at night to urinate, leakage of urine, stream starts and stops, trouble starting your stream, have to strain to urinate, and being pregnant.  Gastrointestinal (Upper):   Patient reports nausea and indigestion/ heartburn. Patient denies vomiting.  Gastrointestinal (Lower):   Patient denies diarrhea and constipation.  Constitutional:   Patient reports fever, night sweats, and fatigue. Patient denies weight loss.  Skin:   Patient denies skin rash/ lesion and itching.  Eyes:   Patient denies blurred vision and double vision.  Ears/ Nose/ Throat:   Patient denies sore throat and sinus problems.  Hematologic/Lymphatic:   Patient denies swollen glands and easy bruising.  Cardiovascular:   Patient denies leg swelling and chest pains.  Respiratory:   Patient denies cough and shortness of breath.  Endocrine:   Patient denies excessive thirst.  Musculoskeletal:   Patient reports back pain. Patient denies joint pain.  Neurological:   Patient denies headaches and dizziness.  Psychologic:   Patient denies depression and anxiety.   VITAL SIGNS:      01/06/2018 11:28 AM  Weight 121 lb /  54.88 kg  Height 61 in / 154.94 cm  BP 99/65 mmHg  Pulse 111 /min  Temperature 98.7 F / 37.0 C  BMI 22.9 kg/m   MULTI-SYSTEM PHYSICAL EXAMINATION:    Constitutional: Well-nourished. No physical deformities. Normally developed. Good grooming.   Respiratory: Normal breath sounds. No labored breathing, no use of accessory muscles.    Cardiovascular: Regular rate and rhythm. No murmur, no gallop. Normal temperature, normal extremity pulses, no swelling, no varicosities. Tachycardia.   Skin: No paleness, no jaundice, no cyanosis. No lesion, no ulcer, no rash.   Neurologic / Psychiatric: Oriented to time, oriented to place, oriented to person. No depression, no anxiety, no agitation.   Gastrointestinal: No mass, no tenderness, no rigidity, non obese abdomen. (+) L CVAT  Musculoskeletal: Normal gait and station of head and neck.     PAST DATA REVIEWED:  Source Of History:  Patient  Records Review:   Previous Patient Records  Urine Test Review:   Urinalysis, Urine Culture  Notes:                     02/19/17 urine culture: no growth   PROCEDURES:         C.T. Urogram - [id number]      Right renal nonobstructing renal calculi. Noticeable moderate/severe left hydronephrosis and stranding to left kidney secondary to 8 mm proximal ureter calculus. No distal ureteral calcification noted.          Urinalysis - 81003 Dipstick Dipstick Cont'd Micro  Specimen: Voided Bilirubin: Neg WBC/hpf: 20 - 40/hpf  Color: Yellow Ketones: Neg RBC/hpf: NS (Not Seen)  Appearance: Cloudy Blood: 2+ Bacteria: Many (>50/hpf)  Specific Gravity: 1.010 Protein: Trace Cystals: NS (Not Seen)  pH: 5.5 Urobilinogen: 0.2 Casts: NS (Not Seen)  Glucose: Neg Nitrites: Neg Trichomonas: Not Present    Leukocyte Esterase: 2+ Mucous: Not Present      Epithelial Cells: 0 - 5/hpf      Yeast: NS (Not Seen)      Sperm: Not Present         Ceftriaxone 1g - 16109, U0454 Qty: 1 Adm. By: Anselm Pancoast  Unit: gram Lot No [id number]  Route: IM Exp. Date 03/02/2019  Freq: None Mfgr.:   Site: Right Buttock   ASSESSMENT:      ICD-10 Details  1 GU:   Flank Pain - R10.84 Left, Acute - Secondary to proximal ureteral stone.   2   Ureteral obstruction secondary to calculous - N13.2 Left, Acute - Secondary to proximal ureteral stone.   3   Ureteral calculus - N20.1 Left,  Acute - Proceed with urgent cystoscopy/stent placement.   4 NON-GU:   Fever, unspecified - R50.9 Acute - Ceftriaxone 1 GM IM today. Culture urine.    PLAN:           Orders Labs Urine Culture  X-Rays: C.T. Stone Protocol Without Contrast          Schedule Procedure: 01/06/2018 at [facility] - [phone] - Ceftriaxone 1g (Injection, Ceftriaxone Sodium, Per 250 Mg) - F6213, 08657          Document Letter(s):  Created for Patient: Clinical Summary         Notes:   Discussed pt situation with Dr. Laverle Patter and he agrees urgent surgical intervention recommended with recent high fevers and hydronephrosis. For ureteroscopy I described the risks which include heart attack, stroke, pulmonary embolus, death, bleeding, infection, damage to contiguous structures, positioning injury, ureteral stricture,  ureteral avulsion, ureteral injury, need for ureteral stent, inability to perform ureteroscopy, need for an interval procedure, inability to clear stone burden, stent discomfort and pain. She understands that today she will only have cystoscopy/stent placement and will need second procedure of either ESWL or cystourethroscopy, (L) RPG, stone extraction at at later time. She understands she will be admitted for at least 23 hrs OBS. She voices understanding and wishes to proceed.

## 2018-01-06 NOTE — Progress Notes (Signed)
PHARMACY NOTE:  ANTIMICROBIAL RENAL DOSAGE ADJUSTMENT  Current antimicrobial regimen includes a mismatch between antimicrobial dosage and estimated renal function.  As per policy approved by the Pharmacy & Therapeutics and Medical Executive Committees, the antimicrobial dosage will be adjusted accordingly.  Current antimicrobial dosage:  Ceftriaxone 1g IV q24h   Indication: pyelonephritis; s/p Cystoscopy; Left ureteral stent placement   Renal Function:  Estimated Creatinine Clearance: 47.3 mL/min (by C-G formula based on SCr of 1 mg/dL).    Antimicrobial dosage has been changed to:  Ceftriaxone 2g IV q24h  Additional comments:   Thank you for allowing pharmacy to be a part of this patient's care.  Jodelle Gross, [facility] 01/06/2018 5:48 PM

## 2018-01-06 NOTE — Anesthesia Postprocedure Evaluation (Signed)
Anesthesia Post Note  Patient: Inetta Dicke  Procedure(s) Performed: CYSTOSCOPY WITH STENT PLACEMENT LEFT (Left )     Patient location during evaluation: PACU Anesthesia Type: General Level of consciousness: awake and alert Pain management: pain level controlled Vital Signs Assessment: post-procedure vital signs reviewed and stable Respiratory status: spontaneous breathing, nonlabored ventilation, respiratory function stable and patient connected to nasal cannula oxygen Cardiovascular status: blood pressure returned to baseline and stable Postop Assessment: no apparent nausea or vomiting Anesthetic complications: no    Last Vitals:  Vitals:   01/06/18 1800 01/06/18 1817  BP: (!) 117/55 113/75  Pulse: (!) 101 (!) 103  Resp: 17 18  Temp: (!) 38 C 37.9 C  SpO2: 95% 93%    Last Pain:  Vitals:   01/06/18 1817  TempSrc: Oral  PainSc:                  Trevor Iha

## 2018-01-06 NOTE — Transfer of Care (Signed)
Immediate Anesthesia Transfer of Care Note  Patient: Paylin Hailu  Procedure(s) Performed: CYSTOSCOPY WITH STENT PLACEMENT LEFT (Left )  Patient Location: PACU  Anesthesia Type:General  Level of Consciousness: drowsy and patient cooperative  Airway & Oxygen Therapy: Patient Spontanous Breathing and Patient connected to face mask oxygen  Post-op Assessment: Report given to RN and Post -op Vital signs reviewed and stable  Post vital signs: Reviewed and stable  Last Vitals:  Vitals Value Taken Time  BP    Temp    Pulse 125 01/06/2018  5:30 PM  Resp 21 01/06/2018  5:30 PM  SpO2 100 % 01/06/2018  5:30 PM  Vitals shown include unvalidated device data.  Last Pain:  Vitals:   01/06/18 1456  TempSrc:   PainSc: 0-No pain         Complications: No apparent anesthesia complications

## 2018-01-06 NOTE — Op Note (Signed)
Preoperative diagnosis:  1. Left ureteral stone, fever  Postoperative diagnosis:  1. Left ureteral stone, fever   Procedure:  1. Cystoscopy 2. Left ureteral stent placement (6 x 24) - no string  Surgeon: Rolly Salter, Montez Hageman. M.D.  Anesthesia: General  Complications: None  EBL: Minimal  Specimens: Left renal pelvic urine culture  Indication: Karla Scott is a 52 y.o. patient with a left ureteral stone and fever. After reviewing the management options for treatment, he elected to proceed with the above surgical procedure(s). We have discussed the potential benefits and risks of the procedure, side effects of the proposed treatment, the likelihood of the patient achieving the goals of the procedure, and any potential problems that might occur during the procedure or recuperation. Informed consent has been obtained.  Description of procedure:  The patient was taken to the operating room and general anesthesia was induced.  The patient was placed in the dorsal lithotomy position, prepped and draped in the usual sterile fashion, and preoperative antibiotics were administered. A preoperative time-out was performed.   Cystourethroscopy was performed.  The patient's urethra was examined and was normal. The bladder was then systematically examined in its entirety. There was no evidence for any bladder tumors, stones, or other mucosal pathology.    Attention then turned to the left ureteral orifice and a ureteral catheter was used to intubate the ureteral orifice.  A wire was advanced up into the renal pelvis. The ureteral catheter was then advanced up to the left renal pelvis and a specimen was obtained for culture.  A 0.38 sensor guidewire was then advanced up the left ureter into the renal pelvis under fluoroscopic guidance.  The wire was then backloaded through the cystoscope and a ureteral stent was advance over the wire using Seldinger technique.  The stent was positioned appropriately  under fluoroscopic and cystoscopic guidance.  The wire was then removed with an adequate stent curl noted in the renal pelvis as well as in the bladder.  The bladder was then emptied and the procedure ended.  The patient appeared to tolerate the procedure well and without complications.  The patient was able to be awakened and transferred to the recovery unit in satisfactory condition.    Moody Bruins MD

## 2018-01-06 NOTE — Progress Notes (Signed)
Pt arrived to unit from PACU on stretcher. Ambulated to floor bed w/ steady, independent gait. VS as charted. Denies pain/discomfort. Ambulated to BR for void. Pt oriented to callbell and environment and POC discussed.

## 2018-01-06 NOTE — Anesthesia Preprocedure Evaluation (Addendum)
Anesthesia Evaluation  Patient identified by MRN, date of birth, ID band Patient awake    Reviewed: Allergy & Precautions, NPO status , Patient's Chart, lab work & pertinent test results  Airway Mallampati: II     Mouth opening: Limited Mouth Opening  Dental  (+) Teeth Intact, Dental Advisory Given   Pulmonary    breath sounds clear to auscultation       Cardiovascular negative cardio ROS   Rhythm:Regular Rate:Normal     Neuro/Psych PSYCHIATRIC DISORDERS Anxiety Depression  Neuromuscular disease    GI/Hepatic negative GI ROS, Neg liver ROS,   Endo/Other  negative endocrine ROS  Renal/GU   negative genitourinary   Musculoskeletal  (+) Arthritis , Osteoarthritis,  Fibromyalgia -  Abdominal (+) - obese,   Peds negative pediatric ROS (+)  Hematology negative hematology ROS (+)   Anesthesia Other Findings   Reproductive/Obstetrics negative OB ROS                            Lab Results  Component Value Date   WBC 8.7 05/30/2016   HGB 11.7 (L) 05/30/2016   HCT 35.6 (L) 05/30/2016   MCV 99.4 05/30/2016   PLT 201 05/30/2016   Lab Results  Component Value Date   CREATININE 0.84 05/30/2016   BUN 9 05/30/2016   NA 140 05/30/2016   K 4.0 05/30/2016   CL 112 (H) 05/30/2016   CO2 24 05/30/2016    Anesthesia Physical Anesthesia Plan  ASA: III  Anesthesia Plan: General   Post-op Pain Management:    Induction: Intravenous  PONV Risk Score and Plan:   Airway Management Planned: LMA  Additional Equipment:   Intra-op Plan:   Post-operative Plan:   Informed Consent: I have reviewed the patients History and Physical, chart, labs and discussed the procedure including the risks, benefits and alternatives for the proposed anesthesia with the patient or authorized representative who has indicated his/her understanding and acceptance.   Dental advisory given  Plan Discussed with:    Anesthesia Plan Comments:         Anesthesia Quick Evaluation

## 2018-01-07 ENCOUNTER — Encounter (HOSPITAL_COMMUNITY): Payer: Self-pay | Admitting: Urology

## 2018-01-07 DIAGNOSIS — N136 Pyonephrosis: Secondary | ICD-10-CM | POA: Diagnosis not present

## 2018-01-07 LAB — CBC
HCT: 37.8 % (ref 36.0–46.0)
Hemoglobin: 12.6 g/dL (ref 12.0–15.0)
MCH: 32.9 pg (ref 26.0–34.0)
MCHC: 33.3 g/dL (ref 30.0–36.0)
MCV: 98.7 fL (ref 78.0–100.0)
PLATELETS: 214 10*3/uL (ref 150–400)
RBC: 3.83 MIL/uL — AB (ref 3.87–5.11)
RDW: 12.5 % (ref 11.5–15.5)
WBC: 14.6 10*3/uL — AB (ref 4.0–10.5)

## 2018-01-07 MED ORDER — CEPHALEXIN 500 MG PO CAPS: 500.0000 mg | ORAL_CAPSULE | Freq: Three times a day (TID) | ORAL | 0 refills | Status: AC

## 2018-01-07 NOTE — Plan of Care (Signed)
Patient D/C home. Verbalized understanding of all instructions. Denied any questions or concerns about instructions. Patient ambulatory from unit refused wheelchair. To be taken home by husband.

## 2018-01-07 NOTE — Discharge Instructions (Signed)
Ureteral Stent Implantation, Care After Refer to this sheet in the next few weeks. These instructions provide you with information about caring for yourself after your procedure. Your health care provider may also give you more specific instructions. Your treatment has been planned according to current medical practices, but problems sometimes occur. Call your health care provider if you have any problems or questions after your procedure.  Be sure to keep follow-up with Dr. Mena Goes to have stent/stone removed   What can I expect after the procedure? After the procedure, it is common to have:  Nausea.  Mild pain when you urinate. You may feel this pain in your lower back or lower abdomen. Pain should stop within a few minutes after you urinate. This may last for up to 1 week.  A small amount of blood in your urine for several days.  Follow these instructions at home:  Medicines  Take over-the-counter and prescription medicines only as told by your health care provider.  If you were prescribed an antibiotic medicine, take it as told by your health care provider. Do not stop taking the antibiotic even if you start to feel better.  Do not drive for 24 hours if you received a sedative.  Do not drive or operate heavy machinery while taking prescription pain medicines. Activity  Return to your normal activities as told by your health care provider. Ask your health care provider what activities are safe for you.  Do not lift anything that is heavier than 10 lb (4.5 kg). Follow this limit for 1 week after your procedure, or for as long as told by your health care provider. General instructions  Watch for any blood in your urine. Call your health care provider if the amount of blood in your urine increases.  If you have a catheter: ? Follow instructions from your health care provider about taking care of your catheter and collection bag. ? Do not take baths, swim, or use a hot tub until  your health care provider approves.  Drink enough fluid to keep your urine clear or pale yellow.  Keep all follow-up visits as told by your health care provider. This is important. Contact a health care provider if:  You have pain that gets worse or does not get better with medicine, especially pain when you urinate.  You have difficulty urinating.  You feel nauseous or you vomit repeatedly during a period of more than 2 days after the procedure. Get help right away if:  Your urine is dark red or has blood clots in it.  You are leaking urine (have incontinence).  The end of the stent comes out of your urethra.  You cannot urinate.  You have sudden, sharp, or severe pain in your abdomen or lower back.  You have a fever. This information is not intended to replace advice given to you by your health care provider. Make sure you discuss any questions you have with your health care provider. Document Released: 04/20/2013 Document Revised: 01/24/2016 Document Reviewed: 03/02/2015 Elsevier Interactive Patient Education  [employer].

## 2018-01-07 NOTE — Discharge Summary (Signed)
Physician Discharge Summary  Patient ID: Karla Scott MRN: [id number] DOB/AGE: Jul 24, 1966 52 y.o.  Admit date: 01/06/2018 Discharge date: 01/07/2018  Admission Diagnoses: left ureteral stone    Discharge Diagnoses:  Active Problems:   Left ureteral stone   Discharged Condition: good  Hospital Course: Pt admitted following urgent left stent for UTI, fever, and left proximal stone. I communicated with Dr. Laverle Patter - no purulent drainage at stent placement. Pt remained AFVSS after one temp post-op. She is well today. Ambulating, tolerating regular diet and minimal pain. No fever or chills today. I discussed with the patient the nature, potential benefits, risks and alternatives to left eswl or urs/laser to address the stone, including side effects of the proposed treatment, the likelihood of the patient achieving the goals of the procedure, and any potential problems that might occur during the procedure or recuperation. All questions answered. Patient elects to proceed with urs/laser and this will be arranged in a week or two.    Consults: None  Significant Diagnostic Studies: none  Treatments: surgery: cystoscopy, left ureteral stent placement   Discharge Exam: Blood pressure (!) 96/42, pulse 65, temperature 98.5 F (36.9 C), temperature source Oral, resp. rate 18, height 5' (1.524 m), weight 54 kg (119 lb), SpO2 97 %. NAD Watching TV talking with a friend Resp - regular effort, depth Abd - soft, NT Ext - no edema   Disposition: Discharge disposition: 01-Home or Self Care        Allergies as of 01/07/2018      Reactions   Ambien [zolpidem Tartrate] Other (See Comments)   Bad dreams   Lipitor [atorvastatin] Other (See Comments)   Joint soreness   Sulfa Antibiotics Hives      Medication List    TAKE these medications   ALPRAZolam 0.25 MG tablet Commonly known as:  XANAX Take 0.25 mg by mouth 2 (two) times daily as needed for anxiety.   cephALEXin 500 MG  capsule Commonly known as:  KEFLEX Take 1 capsule (500 mg total) by mouth 3 (three) times daily for 10 days. Take one capsule po three times a day for 7 days and then one capsule po QHS   cetirizine 10 MG tablet Commonly known as:  ZYRTEC Take 10 mg by mouth daily.   docusate sodium 100 MG capsule Commonly known as:  COLACE Take 100 mg by mouth at bedtime.   DULoxetine 60 MG capsule Commonly known as:  CYMBALTA Take 60 mg by mouth daily.   GAS-X PO Take 1 tablet by mouth daily as needed (gas.).   HYDROcodone-acetaminophen 5-325 MG tablet Commonly known as:  NORCO/VICODIN Take 1 tablet by mouth every 6 (six) hours as needed for moderate pain.   ibuprofen 200 MG tablet Commonly known as:  ADVIL,MOTRIN Take 400 mg by mouth every 6 (six) hours as needed for mild pain.   ondansetron 8 MG tablet Commonly known as:  ZOFRAN Take 8 mg by mouth every 8 (eight) hours as needed for nausea/vomiting.   traZODone 150 MG tablet Commonly known as:  DESYREL Take 150 mg by mouth at bedtime.   UNKNOWN TO PATIENT Inject 1 each into the skin once.      Follow-up Information    Jerilee Field, MD Follow up.   Specialty:  Urology Why:  office will call  Contact information: [address] [place] [address] [phone]           Signed: Jerilee Field 01/07/2018, 1:05 PM

## 2018-01-07 NOTE — Progress Notes (Signed)
Report received from J. Scotton, RN. No change from initial pm assessment. Will continue to monitor and follow the POC. 

## 2018-01-09 LAB — URINE CULTURE: Culture: 2000 — AB

## 2019-07-15 ENCOUNTER — Other Ambulatory Visit: Payer: Self-pay | Admitting: *Deleted

## 2019-07-15 DIAGNOSIS — Z20822 Contact with and (suspected) exposure to covid-19: Secondary | ICD-10-CM

## 2019-07-18 ENCOUNTER — Telehealth: Payer: Self-pay

## 2019-07-18 LAB — NOVEL CORONAVIRUS, NAA: SARS-CoV-2, NAA: NOT DETECTED

## 2019-07-18 NOTE — Telephone Encounter (Signed)
Pt. Checking on COVID 19 results . Not available yet. 

## 2020-03-22 ENCOUNTER — Other Ambulatory Visit: Payer: Self-pay | Admitting: Urology

## 2020-03-23 ENCOUNTER — Ambulatory Visit (HOSPITAL_BASED_OUTPATIENT_CLINIC_OR_DEPARTMENT_OTHER): Payer: BC Managed Care – PPO | Admitting: Certified Registered"

## 2020-03-23 ENCOUNTER — Encounter (HOSPITAL_BASED_OUTPATIENT_CLINIC_OR_DEPARTMENT_OTHER): Payer: Self-pay | Admitting: Urology

## 2020-03-23 ENCOUNTER — Other Ambulatory Visit (HOSPITAL_COMMUNITY)
Admission: RE | Admit: 2020-03-23 | Discharge: 2020-03-23 | Disposition: A | Discharge status: Home / Self Care | Payer: BC Managed Care – PPO | Source: Ambulatory Visit | Attending: Urology | Admitting: Urology

## 2020-03-23 ENCOUNTER — Ambulatory Visit (HOSPITAL_BASED_OUTPATIENT_CLINIC_OR_DEPARTMENT_OTHER)
Admission: RE | Admit: 2020-03-23 | Discharge: 2020-03-23 | Disposition: A | Discharge status: Home / Self Care | Payer: BC Managed Care – PPO | Attending: Urology | Admitting: Urology

## 2020-03-23 ENCOUNTER — Encounter (HOSPITAL_BASED_OUTPATIENT_CLINIC_OR_DEPARTMENT_OTHER)
Admission: RE | Disposition: A | Discharge status: Home / Self Care | Payer: Self-pay | Source: Home / Self Care | Attending: Urology

## 2020-03-23 DIAGNOSIS — I700 Atherosclerosis of aorta: Secondary | ICD-10-CM | POA: Diagnosis not present

## 2020-03-23 DIAGNOSIS — Z87442 Personal history of urinary calculi: Secondary | ICD-10-CM | POA: Insufficient documentation

## 2020-03-23 DIAGNOSIS — N201 Calculus of ureter: Secondary | ICD-10-CM | POA: Diagnosis not present

## 2020-03-23 DIAGNOSIS — Z882 Allergy status to sulfonamides status: Secondary | ICD-10-CM | POA: Insufficient documentation

## 2020-03-23 DIAGNOSIS — F17210 Nicotine dependence, cigarettes, uncomplicated: Secondary | ICD-10-CM | POA: Insufficient documentation

## 2020-03-23 DIAGNOSIS — Z87440 Personal history of urinary (tract) infections: Secondary | ICD-10-CM | POA: Diagnosis not present

## 2020-03-23 DIAGNOSIS — I471 Supraventricular tachycardia: Secondary | ICD-10-CM | POA: Diagnosis not present

## 2020-03-23 DIAGNOSIS — M797 Fibromyalgia: Secondary | ICD-10-CM | POA: Insufficient documentation

## 2020-03-23 DIAGNOSIS — Z20822 Contact with and (suspected) exposure to covid-19: Secondary | ICD-10-CM | POA: Insufficient documentation

## 2020-03-23 DIAGNOSIS — Z888 Allergy status to other drugs, medicaments and biological substances status: Secondary | ICD-10-CM | POA: Insufficient documentation

## 2020-03-23 DIAGNOSIS — N136 Pyonephrosis: Secondary | ICD-10-CM | POA: Diagnosis not present

## 2020-03-23 HISTORY — PX: CYSTOSCOPY WITH STENT PLACEMENT: SHX5790

## 2020-03-23 LAB — SARS CORONAVIRUS 2 BY RT PCR (HOSPITAL ORDER, PERFORMED IN ~~LOC~~ HOSPITAL LAB): SARS Coronavirus 2: NEGATIVE

## 2020-03-23 SURGERY — CYSTOSCOPY, WITH STENT INSERTION
Anesthesia: General | Site: Ureter | Laterality: Bilateral

## 2020-03-23 MED ORDER — OXYCODONE HCL 5 MG/5ML PO SOLN: 5.0000 mg | Freq: Once | ORAL | Status: DC | PRN

## 2020-03-23 MED ORDER — LACTATED RINGERS IV SOLN: INTRAVENOUS | Status: DC

## 2020-03-23 MED ORDER — MIDAZOLAM HCL 2 MG/2ML IJ SOLN
INTRAMUSCULAR | Status: DC | PRN
  Administered 2020-03-23: 2 mg via INTRAVENOUS

## 2020-03-23 MED ORDER — OXYCODONE HCL 5 MG PO TABS: 5.0000 mg | ORAL_TABLET | Freq: Once | ORAL | Status: DC | PRN

## 2020-03-23 MED ORDER — IOHEXOL 300 MG/ML  SOLN
INTRAMUSCULAR | Status: DC | PRN
  Administered 2020-03-23: 30 mL

## 2020-03-23 MED ORDER — ONDANSETRON HCL 4 MG/2ML IJ SOLN: 4.0000 mg | Freq: Four times a day (QID) | INTRAMUSCULAR | Status: DC | PRN

## 2020-03-23 MED ORDER — FENTANYL CITRATE (PF) 100 MCG/2ML IJ SOLN: 25.0000 ug | INTRAMUSCULAR | Status: DC | PRN

## 2020-03-23 MED ORDER — MIDAZOLAM HCL 2 MG/2ML IJ SOLN
INTRAMUSCULAR | Status: AC
  Filled 2020-03-23: qty 2

## 2020-03-23 MED ORDER — PROPOFOL 10 MG/ML IV BOLUS
INTRAVENOUS | Status: AC
  Filled 2020-03-23: qty 20

## 2020-03-23 MED ORDER — CEFAZOLIN SODIUM-DEXTROSE 2-4 GM/100ML-% IV SOLN
INTRAVENOUS | Status: AC
  Filled 2020-03-23: qty 100

## 2020-03-23 MED ORDER — LIDOCAINE 2% (20 MG/ML) 5 ML SYRINGE
INTRAMUSCULAR | Status: AC
  Filled 2020-03-23: qty 5

## 2020-03-23 MED ORDER — CEPHALEXIN 500 MG PO CAPS
500.0000 mg | ORAL_CAPSULE | Freq: Two times a day (BID) | ORAL | 0 refills | Status: AC
Start: 2020-03-23 — End: 2020-03-30

## 2020-03-23 MED ORDER — PROPOFOL 10 MG/ML IV BOLUS
INTRAVENOUS | Status: DC | PRN
  Administered 2020-03-23: 150 mg via INTRAVENOUS

## 2020-03-23 MED ORDER — DEXAMETHASONE SODIUM PHOSPHATE 10 MG/ML IJ SOLN
INTRAMUSCULAR | Status: DC | PRN
Start: 2020-03-23 — End: 2020-03-23
  Administered 2020-03-23: 5 mg via INTRAVENOUS

## 2020-03-23 MED ORDER — FENTANYL CITRATE (PF) 100 MCG/2ML IJ SOLN
INTRAMUSCULAR | Status: DC | PRN
  Administered 2020-03-23 (×2): 25 ug via INTRAVENOUS
  Administered 2020-03-23: 50 ug via INTRAVENOUS

## 2020-03-23 MED ORDER — STERILE WATER FOR IRRIGATION IR SOLN
Status: DC | PRN
Start: 2020-03-23 — End: 2020-03-23
  Administered 2020-03-23: 3000 mL

## 2020-03-23 MED ORDER — LIDOCAINE HCL (CARDIAC) PF 100 MG/5ML IV SOSY
PREFILLED_SYRINGE | INTRAVENOUS | Status: DC | PRN
  Administered 2020-03-23: 60 mg via INTRAVENOUS

## 2020-03-23 MED ORDER — ONDANSETRON HCL 4 MG/2ML IJ SOLN
INTRAMUSCULAR | Status: DC | PRN
Start: 2020-03-23 — End: 2020-03-23
  Administered 2020-03-23: 4 mg via INTRAVENOUS

## 2020-03-23 MED ORDER — CEFAZOLIN SODIUM-DEXTROSE 2-4 GM/100ML-% IV SOLN
2.0000 g | INTRAVENOUS | Status: AC
  Administered 2020-03-23: 2 g via INTRAVENOUS

## 2020-03-23 MED ORDER — FENTANYL CITRATE (PF) 100 MCG/2ML IJ SOLN
INTRAMUSCULAR | Status: AC
  Filled 2020-03-23: qty 2

## 2020-03-23 SURGICAL SUPPLY — 33 items
APL SKNCLS STERI-STRIP NONHPOA (GAUZE/BANDAGES/DRESSINGS)
BAG DRAIN URO-CYSTO SKYTR STRL (DRAIN) ×3 IMPLANT
BAG DRN UROCATH (DRAIN) ×1
BASKET STONE 1.7 NGAGE (UROLOGICAL SUPPLIES) IMPLANT
BASKET ZERO TIP NITINOL 2.4FR (BASKET) ×3 IMPLANT
BENZOIN TINCTURE PRP APPL 2/3 (GAUZE/BANDAGES/DRESSINGS) IMPLANT
BSKT STON RTRVL ZERO TP 2.4FR (BASKET) ×1
CATH URET 5FR 28IN OPEN ENDED (CATHETERS) IMPLANT
CLOSURE WOUND 1/2 X4 (GAUZE/BANDAGES/DRESSINGS)
CLOTH BEACON ORANGE TIMEOUT ST (SAFETY) ×3 IMPLANT
FIBER LASER FLEXIVA 365 (UROLOGICAL SUPPLIES) IMPLANT
FIBER LASER TRAC TIP (UROLOGICAL SUPPLIES) IMPLANT
GLOVE BIO SURGEON STRL SZ7.5 (GLOVE) ×3 IMPLANT
GLOVE BIOGEL PI IND STRL 6.5 (GLOVE) ×1 IMPLANT
GLOVE BIOGEL PI IND STRL 7.0 (GLOVE) ×1 IMPLANT
GLOVE BIOGEL PI INDICATOR 6.5 (GLOVE) ×2
GLOVE BIOGEL PI INDICATOR 7.0 (GLOVE) ×2
GLOVE ECLIPSE 6.5 STRL STRAW (GLOVE) ×3 IMPLANT
GOWN STRL REUS W/TWL LRG LVL3 (GOWN DISPOSABLE) ×3 IMPLANT
GOWN STRL REUS W/TWL XL LVL3 (GOWN DISPOSABLE) ×3 IMPLANT
GUIDEWIRE STR DUAL SENSOR (WIRE) IMPLANT
GUIDEWIRE ZIPWRE .038 STRAIGHT (WIRE) ×3 IMPLANT
IV NS IRRIG 3000ML ARTHROMATIC (IV SOLUTION) ×6 IMPLANT
KIT TURNOVER CYSTO (KITS) ×3 IMPLANT
MANIFOLD NEPTUNE II (INSTRUMENTS) ×3 IMPLANT
NS IRRIG 500ML POUR BTL (IV SOLUTION) ×3 IMPLANT
PACK CYSTO (CUSTOM PROCEDURE TRAY) ×3 IMPLANT
STENT URET 6FRX24 CONTOUR (STENTS) ×6 IMPLANT
STRIP CLOSURE SKIN 1/2X4 (GAUZE/BANDAGES/DRESSINGS) IMPLANT
SYR 10ML LL (SYRINGE) ×3 IMPLANT
TUBE CONNECTING 12'X1/4 (SUCTIONS)
TUBE CONNECTING 12X1/4 (SUCTIONS) IMPLANT
TUBING UROLOGY SET (TUBING) ×3 IMPLANT

## 2020-03-23 NOTE — H&P (Signed)
Urology Preoperative H&P   Chief Complaint: Bilateral flank pain  History of Present Illness: Karla Scott is a 54 y.o. female with a history of kidney stones.  She presented to the office on 03/22/2020 with acute bilateral flank pain.  Subsequent CT stone study revealed bilateral obstructing proximal ureteral calculi along with bilateral nonobstructing renal calculi.  She continues to have intermittent episodes of sharp, nonradiating bilateral flank pain associated with gross hematuria.  She denies interval fever/chills or dysuria.  Urinalysis performed in the office yesterday did have features concerning for acute cystitis.  She received an IM injection of 1 g of Rocephin yesterday.  Past Medical History:  Diagnosis Date  . Abdominal pain   . Acute bronchitis   . Acute upper respiratory infection   . Backache   . Cervicalgia   . Chest pain   . Chronic pain in left foot   . Costalchondritis   . CTS (carpal tunnel syndrome)   . Depressive disorder   . Dizziness   . Fatigue   . Fibromyalgia   . Hypercholesteremia   . Influenza   . Insomnia   . Joint pain   . Kidney stone   . Lateral epicondylitis of right elbow   . Nausea   . Palpitations   . Paroxysmal supraventricular tachycardia (HCC)   . Pleurisy   . Sinusitis   . Tobacco abuse   . Trigeminal neuralgia   . Ulnar neuropathy at elbow   . UTI (urinary tract infection)   . Viral labyrinthitis     Past Surgical History:  Procedure Laterality Date  . CYSTOSCOPY WITH STENT PLACEMENT Right 05/29/2016   Procedure: CYSTOSCOPY, RETROGRADE, STENT PLACEMENT;  Surgeon: Jerilee Field, MD;  Location: [facility];  Service: Urology;  Laterality: Right;  . CYSTOSCOPY WITH STENT PLACEMENT Left 01/06/2018   Procedure: CYSTOSCOPY WITH STENT PLACEMENT LEFT;  Surgeon: Heloise Purpura, MD;  Location: [facility];  Service: Urology;  Laterality: Left;  . LITHOTRIPSY    . URETEROSCOPY WITH HOLMIUM LASER LITHOTRIPSY Right 06/10/2016   Procedure: RIGHT  URETEROSCOPY WITH HOLMIUM LASER LITHOTRIPSY AND STENT;  Surgeon: Jerilee Field, MD;  Location: [facility];  Service: Urology;  Laterality: Right;  Marland Kitchen VAGINAL HYSTERECTOMY      Allergies:  Allergies  Allergen Reactions  . Ambien [Zolpidem Tartrate] Other (See Comments)    Bad dreams  . Lipitor [Atorvastatin] Other (See Comments)    Joint soreness  . Sulfa Antibiotics Hives    Family History  Problem Relation Age of Onset  . Kidney disease Mother   . CVA Father   . Kidney Stones Sister   . Heart attack Sister   . Healthy Son     Social History:  reports that she has been smoking cigarettes. She has a 15.00 pack-year smoking history. She has never used smokeless tobacco. She reports current alcohol use. She reports that she does not use drugs.  ROS: A complete review of systems was performed.  All systems are negative except for pertinent findings as noted.  Physical Exam:  Vital signs in last 24 hours: Temp:  [100 F (37.8 C)] 100 F (37.8 C) (07/23 1211) Pulse Rate:  [97] 97 (07/23 1211) Resp:  [24] 24 (07/23 1211) BP: (103)/(44) 103/44 (07/23 1211) SpO2:  [97 %] 97 % (07/23 1211) Weight:  [54.3 kg] 54.3 kg (07/23 1211) Constitutional:  Alert and oriented, No acute distress Cardiovascular: Regular rate and rhythm, No JVD Respiratory: Normal respiratory effort, Lungs clear bilaterally GI: Abdomen is soft,  nontender, nondistended, no abdominal masses GU: No CVA tenderness Lymphatic: No lymphadenopathy Neurologic: Grossly intact, no focal deficits Psychiatric: Normal mood and affect  Laboratory Data:  No results for input(s): WBC, HGB, HCT, PLT in the last 72 hours.  No results for input(s): NA, K, CL, GLUCOSE, BUN, CALCIUM, CREATININE in the last 72 hours.  Invalid input(s): CO3   Results for orders placed or performed during the hospital encounter of 03/23/20 (from the past 24 hour(s))  SARS Coronavirus 2 by RT PCR (hospital order, performed in [facility] hospital  lab) Nasopharyngeal Nasopharyngeal Swab     Status: None   Collection Time: 03/23/20  9:47 AM   Specimen: Nasopharyngeal Swab  Result Value Ref Range   SARS Coronavirus 2 NEGATIVE NEGATIVE   Recent Results (from the past 240 hour(s))  SARS Coronavirus 2 by RT PCR (hospital order, performed in [facility] hospital lab) Nasopharyngeal Nasopharyngeal Swab     Status: None   Collection Time: 03/23/20  9:47 AM   Specimen: Nasopharyngeal Swab  Result Value Ref Range Status   SARS Coronavirus 2 NEGATIVE NEGATIVE Final    Comment: (NOTE) SARS-CoV-2 target nucleic acids are NOT DETECTED.  The SARS-CoV-2 RNA is generally detectable in upper and lower respiratory specimens during the acute phase of infection. The lowest concentration of SARS-CoV-2 viral copies this assay can detect is 250 copies / mL. A negative result does not preclude SARS-CoV-2 infection and should not be used as the sole basis for treatment or other patient management decisions.  A negative result may occur with improper specimen collection / handling, submission of specimen other than nasopharyngeal swab, presence of viral mutation(s) within the areas targeted by this assay, and inadequate number of viral copies (<250 copies / mL). A negative result must be combined with clinical observations, patient history, and epidemiological information.  Fact Sheet for Patients:   BoilerBrush.com.cy  Fact Sheet for Healthcare Providers: https://pope.com/  This test is not yet approved or  cleared by the [place] FDA and has been authorized for detection and/or diagnosis of SARS-CoV-2 by FDA under an Emergency Use Authorization (EUA).  This EUA will remain in effect (meaning this test can be used) for the duration of the COVID-19 declaration under Section 564(b)(1) of the Act, 21 U.S.C. section 360bbb-3(b)(1), unless the authorization is terminated or revoked  sooner.  Performed at [facility], [address], [place], [place] [address]     Renal Function: No results for input(s): CREATININE in the last 168 hours. CrCl cannot be calculated (Patient's most recent lab result is older than the maximum 21 days allowed.).  Radiologic Imaging: CLINICAL DATA: Flank pain.  EXAM: CT ABDOMEN AND PELVIS WITHOUT CONTRAST  TECHNIQUE: Multidetector CT imaging of the abdomen and pelvis was performed following the standard protocol without IV contrast.  COMPARISON: 01/06/2018  FINDINGS: Lower chest: Unremarkable.  Hepatobiliary: No focal abnormality in the liver on this study without intravenous contrast. There is no evidence for gallstones, gallbladder wall thickening, or pericholecystic fluid. No intrahepatic or extrahepatic biliary dilation.  Pancreas: No focal mass lesion. No dilatation of the main duct. No intraparenchymal cyst. No peripancreatic edema.  Spleen: No splenomegaly. No focal mass lesion.  Adrenals/Urinary Tract: No adrenal nodule or mass.  Several nonobstructing stones are seen scattered in the right kidney ranging in size from 1 mm up to 3 mm. There is right hydroureteronephrosis with right ureteral dilatation extending down to the level of a 6 x 9 x 7 mm ureteral  stone positioned at the level of the right external iliac vessels. No additional right ureteral stone.  Tiny 1 mm stone identified in the left kidney (best seen on coronal image 76/601). There is mild left hydronephrosis secondary to the presence of a 4 x 4 x 7 mm stone at the left UPJ. No left ureteral stone. No left hydroureter.  No bladder stones.  Stomach/Bowel: Stomach is unremarkable. No gastric wall thickening. No evidence of outlet obstruction. Duodenum is normally positioned as is the ligament of Treitz. No small bowel wall thickening. No small bowel dilatation. The terminal ileum is normal. The appendix is not visualized, but there is no  edema or inflammation in the region of the cecum. No gross colonic mass. No colonic wall thickening.  Vascular/Lymphatic: There is abdominal aortic atherosclerosis without aneurysm. There is no gastrohepatic or hepatoduodenal ligament lymphadenopathy. No retroperitoneal or mesenteric lymphadenopathy. No pelvic sidewall lymphadenopathy.  Reproductive: The uterus is surgically absent. There is no adnexal mass.  Other: No intraperitoneal free fluid.  Musculoskeletal: No worrisome lytic or sclerotic osseous abnormality.  IMPRESSION: 1. 6 x 9 x 7 mm right ureteral stone at the level of the right external iliac vessels causes mild to moderate right hydroureteronephrosis. 2. 4 x 4 x 7 mm left UPJ stone with mild left hydronephrosis. 3. Bilateral nonobstructing nephrolithiasis. 4. Aortic Atherosclerosis (ICD10-I70.0).   Electronically Signed By: Kennith Center M.D. On: 03/23/2020 10:02 I independently reviewed the above imaging studies.  Assessment and Plan Eileen Kangas is a 54 y.o. female with bilateral obstructing ureteral calculi and bilateral nonobstructing renal calculi  -The risks, benefits and alternatives of cystoscopy with BILATERAL JJ stent placement was discussed with the patient.  Risks include, but are not limited to: bleeding, urinary tract infection, ureteral injury, ureteral stricture disease, chronic pain, urinary symptoms, bladder injury, stent migration, the need for nephrostomy tube placement, MI, CVA, DVT, PE and the inherent risks with general anesthesia.  The patient voices understanding and wishes to proceed.   Rhoderick Moody, MD 03/23/2020, 2:43 PM  [facility] Pager: [phone]

## 2020-03-23 NOTE — Discharge Instructions (Signed)

## 2020-03-23 NOTE — Op Note (Signed)
Operative Note  Preoperative diagnosis:  1.  Bilateral obstructing ureteral calculi 2.  Urinary tract infection  Postoperative diagnosis: Same  Procedure(s): 1.  Cystoscopy with bilateral JJ stent placement 2.  Bilateral retrograde pyelograms with intraoperative interpretation of fluoroscopic imaging  Surgeon: Rhoderick Moody, MD  Assistants:  None  Anesthesia:  General  Complications:  None  EBL: Less than 5 mL  Specimens: 1.  None  Drains/Catheters: 1.  Bilateral 6 French, 24 cm JJ stents without tethers  Intraoperative findings:   1. Right retrograde pyelogram revealed a filling defect in the midportion of the right ureter, consistent with the obstructing stone seen on cross-sectional imaging from 03/22/2020.  The proximal aspects of the ureter and renal pelvis were uniformly dilated with no other filling defects. 2. Solitary left collecting system with no filling defects or dilation involving the left ureter or left renal pelvis seen on retrograde pyelogram   Indication:  Karla Scott is a 54 y.o. female with bilateral obstructing ureteral calculi and a urinary tract infection.  She has been consented for the above procedures, voices understanding and wishes to proceed.  Description of procedure:  After informed consent was obtained, the patient was brought to the operating room and general LMA anesthesia was administered. The patient was then placed in the dorsolithotomy position and prepped and draped in the usual sterile fashion. A timeout was performed. A 23 French rigid cystoscope was then inserted into the urethral meatus and advanced into the bladder under direct vision. A complete bladder survey revealed no intravesical pathology.  A 5 French ureteral catheter was then inserted into the left ureteral orifice and a retrograde pyelogram was obtained, with the findings listed above.  A Glidewire was then used to intubate the lumen of the ureteral catheter and was  advanced up to the left renal pelvis, under fluoroscopic guidance.  The catheter was then removed, leaving the wire in place.  A 6 French, 24 cm JJ stent was then placed over the wire and into good position within the left collecting system, confirming placement via fluoroscopy.  A 5 French ureteral catheter was then inserted into the right ureteral orifice and a retrograde pyelogram was obtained, with the findings listed above.  A Glidewire was then used to intubate the lumen of the ureteral catheter and was advanced up to the right renal pelvis, under fluoroscopic guidance.  The catheter was then removed, leaving the wire in place.  A 6 French, 24 cm JJ stent was then placed over the wire and into good position within the right collecting system, confirming placement via fluoroscopy.  The patient's bladder was then drained.  She tolerated the procedure well and was transferred to the postanesthesia in stable condition.  Plan: Discharge home.  Start empiric course of Keflex.  She will need definitive stone treatment in 1 to 2 weeks.

## 2020-03-23 NOTE — Anesthesia Preprocedure Evaluation (Signed)
Anesthesia Evaluation  Patient identified by MRN, date of birth, ID band Patient awake    Reviewed: Allergy & Precautions, H&P , NPO status , Patient's Chart, lab work & pertinent test results  Airway Mallampati: II   Neck ROM: full    Dental   Pulmonary Current Smoker,    breath sounds clear to auscultation       Cardiovascular + dysrhythmias  Rhythm:regular Rate:Normal  H/o paroxysmal SVT   Neuro/Psych PSYCHIATRIC DISORDERS Anxiety Depression  Neuromuscular disease    GI/Hepatic   Endo/Other    Renal/GU stones     Musculoskeletal  (+) Arthritis , Fibromyalgia -  Abdominal   Peds  Hematology   Anesthesia Other Findings   Reproductive/Obstetrics                             Anesthesia Physical Anesthesia Plan  ASA: II  Anesthesia Plan: General   Post-op Pain Management:    Induction: Intravenous  PONV Risk Score and Plan: 2 and Ondansetron, Dexamethasone, Midazolam and Treatment may vary due to age or medical condition  Airway Management Planned: LMA  Additional Equipment:   Intra-op Plan:   Post-operative Plan: Extubation in OR  Informed Consent: I have reviewed the patients History and Physical, chart, labs and discussed the procedure including the risks, benefits and alternatives for the proposed anesthesia with the patient or authorized representative who has indicated his/her understanding and acceptance.       Plan Discussed with: CRNA, Anesthesiologist and Surgeon  Anesthesia Plan Comments:         Anesthesia Quick Evaluation

## 2020-03-23 NOTE — Transfer of Care (Signed)
Immediate Anesthesia Transfer of Care Note  Patient: Karla Scott  Procedure(s) Performed: CYSTOSCOPY WITH  BILATERAL STENT PLACEMENT (Bilateral Ureter)  Patient Location: PACU  Anesthesia Type:General  Level of Consciousness: awake, alert , oriented and patient cooperative  Airway & Oxygen Therapy: Patient Spontanous Breathing  Post-op Assessment: Report given to RN and Post -op Vital signs reviewed and stable  Post vital signs: Reviewed and stable  Last Vitals:  Vitals Value Taken Time  BP 129/66   Temp    Pulse 100   Resp    SpO2 96%     Last Pain:  Vitals:   03/23/20 1211  TempSrc: Oral  PainSc: 3       Patients Stated Pain Goal: 5 (03/23/20 1211)  Complications: No complications documented.

## 2020-03-23 NOTE — H&P (View-Only) (Signed)
Urology Preoperative H&P   Chief Complaint: Bilateral flank pain  History of Present Illness: Karla Scott is a 53 y.o. female with a history of kidney stones.  She presented to the office on 03/22/2020 with acute bilateral flank pain.  Subsequent CT stone study revealed bilateral obstructing proximal ureteral calculi along with bilateral nonobstructing renal calculi.  She continues to have intermittent episodes of sharp, nonradiating bilateral flank pain associated with gross hematuria.  She denies interval fever/chills or dysuria.  Urinalysis performed in the office yesterday did have features concerning for acute cystitis.  She received an IM injection of 1 g of Rocephin yesterday.  Past Medical History:  Diagnosis Date  . Abdominal pain   . Acute bronchitis   . Acute upper respiratory infection   . Backache   . Cervicalgia   . Chest pain   . Chronic pain in left foot   . Costalchondritis   . CTS (carpal tunnel syndrome)   . Depressive disorder   . Dizziness   . Fatigue   . Fibromyalgia   . Hypercholesteremia   . Influenza   . Insomnia   . Joint pain   . Kidney stone   . Lateral epicondylitis of right elbow   . Nausea   . Palpitations   . Paroxysmal supraventricular tachycardia (HCC)   . Pleurisy   . Sinusitis   . Tobacco abuse   . Trigeminal neuralgia   . Ulnar neuropathy at elbow   . UTI (urinary tract infection)   . Viral labyrinthitis     Past Surgical History:  Procedure Laterality Date  . CYSTOSCOPY WITH STENT PLACEMENT Right 05/29/2016   Procedure: CYSTOSCOPY, RETROGRADE, STENT PLACEMENT;  Surgeon: Matthew Eskridge, MD;  Location: [facility];  Service: Urology;  Laterality: Right;  . CYSTOSCOPY WITH STENT PLACEMENT Left 01/06/2018   Procedure: CYSTOSCOPY WITH STENT PLACEMENT LEFT;  Surgeon: Borden, Lester, MD;  Location: [facility];  Service: Urology;  Laterality: Left;  . LITHOTRIPSY    . URETEROSCOPY WITH HOLMIUM LASER LITHOTRIPSY Right 06/10/2016   Procedure: RIGHT  URETEROSCOPY WITH HOLMIUM LASER LITHOTRIPSY AND STENT;  Surgeon: Matthew Eskridge, MD;  Location: [facility];  Service: Urology;  Laterality: Right;  . VAGINAL HYSTERECTOMY      Allergies:  Allergies  Allergen Reactions  . Ambien [Zolpidem Tartrate] Other (See Comments)    Bad dreams  . Lipitor [Atorvastatin] Other (See Comments)    Joint soreness  . Sulfa Antibiotics Hives    Family History  Problem Relation Age of Onset  . Kidney disease Mother   . CVA Father   . Kidney Stones Sister   . Heart attack Sister   . Healthy Son     Social History:  reports that she has been smoking cigarettes. She has a 15.00 pack-year smoking history. She has never used smokeless tobacco. She reports current alcohol use. She reports that she does not use drugs.  ROS: A complete review of systems was performed.  All systems are negative except for pertinent findings as noted.  Physical Exam:  Vital signs in last 24 hours: Temp:  [100 F (37.8 C)] 100 F (37.8 C) (07/23 1211) Pulse Rate:  [97] 97 (07/23 1211) Resp:  [24] 24 (07/23 1211) BP: (103)/(44) 103/44 (07/23 1211) SpO2:  [97 %] 97 % (07/23 1211) Weight:  [54.3 kg] 54.3 kg (07/23 1211) Constitutional:  Alert and oriented, No acute distress Cardiovascular: Regular rate and rhythm, No JVD Respiratory: Normal respiratory effort, Lungs clear bilaterally GI: Abdomen is soft,   nontender, nondistended, no abdominal masses GU: No CVA tenderness Lymphatic: No lymphadenopathy Neurologic: Grossly intact, no focal deficits Psychiatric: Normal mood and affect  Laboratory Data:  No results for input(s): WBC, HGB, HCT, PLT in the last 72 hours.  No results for input(s): NA, K, CL, GLUCOSE, BUN, CALCIUM, CREATININE in the last 72 hours.  Invalid input(s): CO3   Results for orders placed or performed during the hospital encounter of 03/23/20 (from the past 24 hour(s))  SARS Coronavirus 2 by RT PCR (hospital order, performed in [place] hospital  lab) Nasopharyngeal Nasopharyngeal Swab     Status: None   Collection Time: 03/23/20  9:47 AM   Specimen: Nasopharyngeal Swab  Result Value Ref Range   SARS Coronavirus 2 NEGATIVE NEGATIVE   Recent Results (from the past 240 hour(s))  SARS Coronavirus 2 by RT PCR (hospital order, performed in [place] hospital lab) Nasopharyngeal Nasopharyngeal Swab     Status: None   Collection Time: 03/23/20  9:47 AM   Specimen: Nasopharyngeal Swab  Result Value Ref Range Status   SARS Coronavirus 2 NEGATIVE NEGATIVE Final    Comment: (NOTE) SARS-CoV-2 target nucleic acids are NOT DETECTED.  The SARS-CoV-2 RNA is generally detectable in upper and lower respiratory specimens during the acute phase of infection. The lowest concentration of SARS-CoV-2 viral copies this assay can detect is 250 copies / mL. A negative result does not preclude SARS-CoV-2 infection and should not be used as the sole basis for treatment or other patient management decisions.  A negative result may occur with improper specimen collection / handling, submission of specimen other than nasopharyngeal swab, presence of viral mutation(s) within the areas targeted by this assay, and inadequate number of viral copies (<250 copies / mL). A negative result must be combined with clinical observations, patient history, and epidemiological information.  Fact Sheet for Patients:   https://www.fda.gov/media/136312/download  Fact Sheet for Healthcare Providers: https://www.fda.gov/media/136313/download  This test is not yet approved or  cleared by the [place] FDA and has been authorized for detection and/or diagnosis of SARS-CoV-2 by FDA under an Emergency Use Authorization (EUA).  This EUA will remain in effect (meaning this test can be used) for the duration of the COVID-19 declaration under Section 564(b)(1) of the Act, 21 U.S.C. section 360bbb-3(b)(1), unless the authorization is terminated or revoked  sooner.  Performed at [facility], [address] , [place] [address]     Renal Function: No results for input(s): CREATININE in the last 168 hours. CrCl cannot be calculated (Patient's most recent lab result is older than the maximum 21 days allowed.).  Radiologic Imaging: CLINICAL DATA: Flank pain.  EXAM: CT ABDOMEN AND PELVIS WITHOUT CONTRAST  TECHNIQUE: Multidetector CT imaging of the abdomen and pelvis was performed following the standard protocol without IV contrast.  COMPARISON: 01/06/2018  FINDINGS: Lower chest: Unremarkable.  Hepatobiliary: No focal abnormality in the liver on this study without intravenous contrast. There is no evidence for gallstones, gallbladder wall thickening, or pericholecystic fluid. No intrahepatic or extrahepatic biliary dilation.  Pancreas: No focal mass lesion. No dilatation of the main duct. No intraparenchymal cyst. No peripancreatic edema.  Spleen: No splenomegaly. No focal mass lesion.  Adrenals/Urinary Tract: No adrenal nodule or mass.  Several nonobstructing stones are seen scattered in the right kidney ranging in size from 1 mm up to 3 mm. There is right hydroureteronephrosis with right ureteral dilatation extending down to the level of a 6 x 9 x 7 mm ureteral   stone positioned at the level of the right external iliac vessels. No additional right ureteral stone.  Tiny 1 mm stone identified in the left kidney (best seen on coronal image 76/601). There is mild left hydronephrosis secondary to the presence of a 4 x 4 x 7 mm stone at the left UPJ. No left ureteral stone. No left hydroureter.  No bladder stones.  Stomach/Bowel: Stomach is unremarkable. No gastric wall thickening. No evidence of outlet obstruction. Duodenum is normally positioned as is the ligament of Treitz. No small bowel wall thickening. No small bowel dilatation. The terminal ileum is normal. The appendix is not visualized, but there is no  edema or inflammation in the region of the cecum. No gross colonic mass. No colonic wall thickening.  Vascular/Lymphatic: There is abdominal aortic atherosclerosis without aneurysm. There is no gastrohepatic or hepatoduodenal ligament lymphadenopathy. No retroperitoneal or mesenteric lymphadenopathy. No pelvic sidewall lymphadenopathy.  Reproductive: The uterus is surgically absent. There is no adnexal mass.  Other: No intraperitoneal free fluid.  Musculoskeletal: No worrisome lytic or sclerotic osseous abnormality.  IMPRESSION: 1. 6 x 9 x 7 mm right ureteral stone at the level of the right external iliac vessels causes mild to moderate right hydroureteronephrosis. 2. 4 x 4 x 7 mm left UPJ stone with mild left hydronephrosis. 3. Bilateral nonobstructing nephrolithiasis. 4. Aortic Atherosclerosis (ICD10-I70.0).   Electronically Signed By: Eric Mansell M.D. On: 03/23/2020 10:02 I independently reviewed the above imaging studies.  Assessment and Plan Karla Scott is a 53 y.o. female with bilateral obstructing ureteral calculi and bilateral nonobstructing renal calculi  -The risks, benefits and alternatives of cystoscopy with BILATERAL JJ stent placement was discussed with the patient.  Risks include, but are not limited to: bleeding, urinary tract infection, ureteral injury, ureteral stricture disease, chronic pain, urinary symptoms, bladder injury, stent migration, the need for nephrostomy tube placement, MI, CVA, DVT, PE and the inherent risks with general anesthesia.  The patient voices understanding and wishes to proceed.   Karla Whetstone, MD 03/23/2020, 2:43 PM  [facility] Pager: [phone]  

## 2020-03-23 NOTE — Anesthesia Procedure Notes (Signed)
Procedure Name: LMA Insertion Date/Time: 03/23/2020 2:55 PM Performed by: Yolonda Kida, CRNA Pre-anesthesia Checklist: Patient identified, Emergency Drugs available, Suction available and Patient being monitored Patient Re-evaluated:Patient Re-evaluated prior to induction Oxygen Delivery Method: Circle system utilized Preoxygenation: Pre-oxygenation with 100% oxygen Induction Type: IV induction LMA: LMA inserted LMA Size: 4.0 Number of attempts: 1 Placement Confirmation: positive ETCO2 and breath sounds checked- equal and bilateral Tube secured with: Tape Dental Injury: Teeth and Oropharynx as per pre-operative assessment

## 2020-03-26 ENCOUNTER — Encounter (HOSPITAL_BASED_OUTPATIENT_CLINIC_OR_DEPARTMENT_OTHER): Payer: Self-pay | Admitting: Urology

## 2020-03-27 NOTE — Anesthesia Postprocedure Evaluation (Signed)
Anesthesia Post Note  Patient: Delina Kruczek  Procedure(s) Performed: CYSTOSCOPY WITH  BILATERAL STENT PLACEMENT (Bilateral Ureter)     Patient location during evaluation: PACU Anesthesia Type: General Level of consciousness: awake and alert Pain management: pain level controlled Vital Signs Assessment: post-procedure vital signs reviewed and stable Respiratory status: spontaneous breathing, nonlabored ventilation, respiratory function stable and patient connected to nasal cannula oxygen Cardiovascular status: blood pressure returned to baseline and stable Postop Assessment: no apparent nausea or vomiting Anesthetic complications: no   No complications documented.  Last Vitals:  Vitals:   03/23/20 1600 03/23/20 1615  BP: 115/71 122/74  Pulse: 88 95  Resp: 16 18  Temp: 36.8 C 36.6 C  SpO2: 93% 95%    Last Pain:  Vitals:   03/26/20 0943  TempSrc:   PainSc: 3                  Takayla Baillie S

## 2020-03-28 ENCOUNTER — Other Ambulatory Visit: Payer: Self-pay | Admitting: Urology

## 2020-03-29 ENCOUNTER — Encounter (HOSPITAL_BASED_OUTPATIENT_CLINIC_OR_DEPARTMENT_OTHER): Payer: Self-pay | Admitting: Urology

## 2020-03-29 ENCOUNTER — Other Ambulatory Visit: Payer: Self-pay

## 2020-03-29 NOTE — Progress Notes (Signed)
Spoke w/ via phone for pre-op interview---pt Lab needs dos----   none            Lab results------none COVID test ------03-31-2020 at 925 am Arrive at -------1130 am 04-03-2020 NPO after MN NO Solid Food.  Clear liquids from MN until---1030 am then npo  Medications to take morning of surgery -----xanax prn, duloxetine, certrizine Diabetic medication -----n/a Patient Special Instructions -----none Pre-Op special Istructions -----none Patient verbalized understanding of instructions that were given at this phone interview. Patient denies shortness of breath, chest pain, fever, cough a this phone interview.

## 2020-03-31 ENCOUNTER — Other Ambulatory Visit (HOSPITAL_COMMUNITY)
Admission: RE | Admit: 2020-03-31 | Discharge: 2020-03-31 | Disposition: A | Discharge status: Home / Self Care | Payer: BC Managed Care – PPO | Source: Ambulatory Visit | Attending: Urology | Admitting: Urology

## 2020-03-31 DIAGNOSIS — Z20822 Contact with and (suspected) exposure to covid-19: Secondary | ICD-10-CM | POA: Diagnosis not present

## 2020-03-31 DIAGNOSIS — Z01812 Encounter for preprocedural laboratory examination: Secondary | ICD-10-CM | POA: Diagnosis not present

## 2020-03-31 LAB — SARS CORONAVIRUS 2 (TAT 6-24 HRS): SARS Coronavirus 2: NEGATIVE

## 2020-04-03 ENCOUNTER — Encounter (HOSPITAL_BASED_OUTPATIENT_CLINIC_OR_DEPARTMENT_OTHER): Payer: Self-pay | Admitting: Urology

## 2020-04-03 ENCOUNTER — Other Ambulatory Visit: Payer: Self-pay

## 2020-04-03 ENCOUNTER — Ambulatory Visit (HOSPITAL_BASED_OUTPATIENT_CLINIC_OR_DEPARTMENT_OTHER)
Admission: RE | Admit: 2020-04-03 | Discharge: 2020-04-03 | Disposition: A | Discharge status: Home / Self Care | Payer: BC Managed Care – PPO | Attending: Urology | Admitting: Urology

## 2020-04-03 ENCOUNTER — Ambulatory Visit (HOSPITAL_BASED_OUTPATIENT_CLINIC_OR_DEPARTMENT_OTHER): Payer: BC Managed Care – PPO | Admitting: Anesthesiology

## 2020-04-03 ENCOUNTER — Encounter (HOSPITAL_BASED_OUTPATIENT_CLINIC_OR_DEPARTMENT_OTHER)
Admission: RE | Disposition: A | Discharge status: Home / Self Care | Payer: Self-pay | Source: Home / Self Care | Attending: Urology

## 2020-04-03 DIAGNOSIS — F17210 Nicotine dependence, cigarettes, uncomplicated: Secondary | ICD-10-CM | POA: Insufficient documentation

## 2020-04-03 DIAGNOSIS — N201 Calculus of ureter: Secondary | ICD-10-CM | POA: Diagnosis present

## 2020-04-03 DIAGNOSIS — M797 Fibromyalgia: Secondary | ICD-10-CM | POA: Diagnosis not present

## 2020-04-03 HISTORY — PX: HOLMIUM LASER APPLICATION: SHX5852

## 2020-04-03 HISTORY — DX: Personal history of urinary calculi: Z87.442

## 2020-04-03 HISTORY — PX: CYSTOSCOPY/URETEROSCOPY/HOLMIUM LASER/STENT PLACEMENT: SHX6546

## 2020-04-03 SURGERY — CYSTOSCOPY/URETEROSCOPY/HOLMIUM LASER/STENT PLACEMENT
Anesthesia: General | Site: Renal | Laterality: Bilateral

## 2020-04-03 MED ORDER — CEFAZOLIN SODIUM-DEXTROSE 2-4 GM/100ML-% IV SOLN
INTRAVENOUS | Status: AC
  Filled 2020-04-03: qty 100

## 2020-04-03 MED ORDER — ONDANSETRON HCL 4 MG/2ML IJ SOLN
INTRAMUSCULAR | Status: DC | PRN
  Administered 2020-04-03: 4 mg via INTRAVENOUS

## 2020-04-03 MED ORDER — DEXAMETHASONE SODIUM PHOSPHATE 10 MG/ML IJ SOLN
INTRAMUSCULAR | Status: AC
  Filled 2020-04-03: qty 1

## 2020-04-03 MED ORDER — OXYCODONE HCL 5 MG/5ML PO SOLN: 5.0000 mg | Freq: Once | ORAL | Status: DC | PRN

## 2020-04-03 MED ORDER — DEXAMETHASONE SODIUM PHOSPHATE 4 MG/ML IJ SOLN
INTRAMUSCULAR | Status: DC | PRN
  Administered 2020-04-03: 10 mg via INTRAVENOUS

## 2020-04-03 MED ORDER — FENTANYL CITRATE (PF) 100 MCG/2ML IJ SOLN: 25.0000 ug | INTRAMUSCULAR | Status: DC | PRN

## 2020-04-03 MED ORDER — LIDOCAINE 2% (20 MG/ML) 5 ML SYRINGE
INTRAMUSCULAR | Status: DC | PRN
  Administered 2020-04-03: 75 mg via INTRAVENOUS

## 2020-04-03 MED ORDER — SODIUM CHLORIDE 0.9 % IR SOLN
Status: DC | PRN
  Administered 2020-04-03: 3000 mL

## 2020-04-03 MED ORDER — ONDANSETRON HCL 4 MG/2ML IJ SOLN
INTRAMUSCULAR | Status: AC
  Filled 2020-04-03: qty 2

## 2020-04-03 MED ORDER — FENTANYL CITRATE (PF) 100 MCG/2ML IJ SOLN
INTRAMUSCULAR | Status: AC
  Filled 2020-04-03: qty 2

## 2020-04-03 MED ORDER — LACTATED RINGERS IV SOLN
INTRAVENOUS | Status: DC
  Administered 2020-04-03: 1000 mL via INTRAVENOUS

## 2020-04-03 MED ORDER — PROMETHAZINE HCL 25 MG/ML IJ SOLN: 6.2500 mg | INTRAMUSCULAR | Status: DC | PRN

## 2020-04-03 MED ORDER — PROPOFOL 10 MG/ML IV BOLUS
INTRAVENOUS | Status: DC | PRN
  Administered 2020-04-03: 150 mg via INTRAVENOUS

## 2020-04-03 MED ORDER — FENTANYL CITRATE (PF) 100 MCG/2ML IJ SOLN
INTRAMUSCULAR | Status: DC | PRN
  Administered 2020-04-03 (×8): 25 ug via INTRAVENOUS

## 2020-04-03 MED ORDER — MIDAZOLAM HCL 5 MG/5ML IJ SOLN
INTRAMUSCULAR | Status: DC | PRN
  Administered 2020-04-03: 2 mg via INTRAVENOUS

## 2020-04-03 MED ORDER — LIDOCAINE 2% (20 MG/ML) 5 ML SYRINGE
INTRAMUSCULAR | Status: AC
  Filled 2020-04-03: qty 5

## 2020-04-03 MED ORDER — MEPERIDINE HCL 25 MG/ML IJ SOLN: 6.2500 mg | INTRAMUSCULAR | Status: DC | PRN

## 2020-04-03 MED ORDER — CEPHALEXIN 500 MG PO CAPS: 500.0000 mg | ORAL_CAPSULE | Freq: Every day | ORAL | 0 refills | Status: DC

## 2020-04-03 MED ORDER — CEFAZOLIN SODIUM-DEXTROSE 2-4 GM/100ML-% IV SOLN
2.0000 g | Freq: Once | INTRAVENOUS | Status: AC
  Administered 2020-04-03: 2 g via INTRAVENOUS

## 2020-04-03 MED ORDER — MIDAZOLAM HCL 2 MG/2ML IJ SOLN
INTRAMUSCULAR | Status: AC
  Filled 2020-04-03: qty 2

## 2020-04-03 MED ORDER — OXYCODONE HCL 5 MG PO TABS: 5.0000 mg | ORAL_TABLET | Freq: Once | ORAL | Status: DC | PRN

## 2020-04-03 MED ORDER — PROPOFOL 10 MG/ML IV BOLUS
INTRAVENOUS | Status: AC
  Filled 2020-04-03: qty 20

## 2020-04-03 SURGICAL SUPPLY — 24 items
BAG DRAIN URO-CYSTO SKYTR STRL (DRAIN) ×3 IMPLANT
BAG DRN UROCATH (DRAIN) ×1
BASKET STONE 1.7 NGAGE (UROLOGICAL SUPPLIES) ×3 IMPLANT
BASKET ZERO TIP NITINOL 2.4FR (BASKET) ×3 IMPLANT
BSKT STON RTRVL ZERO TP 2.4FR (BASKET) ×1
CATH URET 5FR 28IN OPEN ENDED (CATHETERS) ×3 IMPLANT
CLOTH BEACON ORANGE TIMEOUT ST (SAFETY) ×3 IMPLANT
FIBER LASER TRAC TIP (UROLOGICAL SUPPLIES) ×3 IMPLANT
GLOVE BIO SURGEON STRL SZ7.5 (GLOVE) ×3 IMPLANT
GLOVE BIOGEL PI IND STRL 6.5 (GLOVE) ×1 IMPLANT
GLOVE BIOGEL PI INDICATOR 6.5 (GLOVE) ×2
GLOVE ECLIPSE 6.5 STRL STRAW (GLOVE) ×3 IMPLANT
GOWN STRL REUS W/TWL LRG LVL3 (GOWN DISPOSABLE) ×6 IMPLANT
GUIDEWIRE STR DUAL SENSOR (WIRE) ×6 IMPLANT
IV NS IRRIG 3000ML ARTHROMATIC (IV SOLUTION) ×3 IMPLANT
KIT TURNOVER CYSTO (KITS) ×3 IMPLANT
MANIFOLD NEPTUNE II (INSTRUMENTS) ×3 IMPLANT
NS IRRIG 500ML POUR BTL (IV SOLUTION) ×3 IMPLANT
PACK CYSTO (CUSTOM PROCEDURE TRAY) ×3 IMPLANT
SHEATH URETERAL 12FRX28CM (UROLOGICAL SUPPLIES) ×3 IMPLANT
STENT URET 6FRX24 CONTOUR (STENTS) ×6 IMPLANT
TUBE CONNECTING 12'X1/4 (SUCTIONS) ×1
TUBE CONNECTING 12X1/4 (SUCTIONS) ×2 IMPLANT
TUBING UROLOGY SET (TUBING) ×3 IMPLANT

## 2020-04-03 NOTE — Op Note (Signed)
Preoperative diagnosis: Bilateral ureteral stones Postoperative diagnosis: Same  Procedure: Cystoscopy with bilateral ureteroscopy, holmium laser lithotripsy, stone basket extraction, ureteral stent exchange  Surgeon: Mena Goes  Anesthesia: General  Indication for procedure: Ms. Karla Scott is a 54 year old female who was stented urgently a couple of weeks ago for concern for UTI bilateral ureteral stones and bilateral hydronephrosis.  She was brought today for definitive stone management.  Findings: Urethra and bladder were unremarkable.  No stone in the bladder.  Left proximal stone had fallen into the left lower pole calyx.  Right mid ureteral stone was located and dusted.  Description of procedure: After consent was obtained patient brought to the operating room.  After adequate anesthesia she was placed lithotomy position and prepped and draped in the usual sterile fashion.  A timeout was performed to confirm the patient and procedure.  The cystoscope was passed per urethra.  The left ureteral stent grasped and removed through the urethral meatus.  A sensor wire was advanced and coiled in the collecting system.  A semirigid ureteroscope was advanced adjacent to the wire and the ureter inspected.  I could see where the stone had been impacted at the left UPJ but it had fallen into the left lower pole calyx and was visible on the fluoroscopy.  A second sensor wire was advanced under direct vision and the semirigid backed out.  A ureteral access sheath was advanced without difficulty and a dual channel digital ureteroscope was advanced.  I tried to grab the stone in the lower pole with an engage basket but it was up under the lip of the calyx and we could not engage it.  Therefore a 0 tip was used to grab the stone and drop it in the upper pole where it was easier to fragment.  The stone was dusted.  There were no significant fragments remaining.  The collecting system was inspected and a couple of small  Randall's plaques or early stones that were forming were dislodged.  Again no significant stone or stone fragment was found and that the access sheath was backed out on the ureteroscope and the scope backed out and the ureter inspected.  No injury to the ureter was noted.  We then turned our attention to the right side by readvancing the cystoscope into the bladder and the right stent was grasped and removed.  A sensor wire was advanced through the stent and coiled in the collecting system and the stent removed.  I then passed the semirigid up the right side adjacent to the wire where the stone was located in the mid ureter.  It was dusted and fragmented at a setting of 0.5 and 50 and 1 and 10.  The 0 tip basket was used to drop some of the fragments in the bladder but no other significant stone fragments were noted inspecting all the way up to the UPJ.  There was some impaction inflammation but no injury to the ureter.  The ureteroscope was backed out.  The wire was backloaded on the cystoscope and a 624 stent advanced up the right side.  The wire was removed with a good coil seen in the collecting system and a good coil in the bladder.  Similarly the left wire was backloaded on the cystoscope and a 624 stent advanced.  The wire was removed with good coil seen both proximally and distally.  The strings were then tucked in the vagina.  She was awakened and taken recovery room in stable condition.  Complications: None  Blood loss: Minimal  Specimens: None  Drains: 6 x 24 cm bilateral ureteral stents with strings  Disposition: Patient stable to PACU

## 2020-04-03 NOTE — Anesthesia Procedure Notes (Signed)
Procedure Name: LMA Insertion Date/Time: 04/03/2020 1:40 PM Performed by: Jessica Priest, CRNA Pre-anesthesia Checklist: Patient identified, Emergency Drugs available, Suction available, Patient being monitored and Timeout performed Patient Re-evaluated:Patient Re-evaluated prior to induction Oxygen Delivery Method: Circle system utilized Preoxygenation: Pre-oxygenation with 100% oxygen Induction Type: IV induction Ventilation: Mask ventilation without difficulty LMA: LMA inserted LMA Size: 3.0 Number of attempts: 1 Airway Equipment and Method: Bite block Placement Confirmation: positive ETCO2,  breath sounds checked- equal and bilateral and CO2 detector Tube secured with: Tape Dental Injury: Teeth and Oropharynx as per pre-operative assessment

## 2020-04-03 NOTE — Discharge Instructions (Signed)
Ureteral Stent Implantation, Care After This sheet gives you information about how to care for yourself after your procedure. Your health care provider may also give you more specific instructions. If you have problems or questions, contact your health care provider.  Removal of the stents-remove both stents by pulling the strings on Friday morning, 04/06/2020.  What can I expect after the procedure? After the procedure, it is common to have:  Nausea.  Mild pain when you urinate. You may feel this pain in your lower back or lower abdomen. The pain should stop within a few minutes after you urinate. This may last for up to 1 week.  A small amount of blood in your urine for several days. Follow these instructions at home: Medicines  Take over-the-counter and prescription medicines only as told by your health care provider.  If you were prescribed an antibiotic medicine, take it as told by your health care provider. Do not stop taking the antibiotic even if you start to feel better.  Do not drive for 24 hours if you were given a sedative during your procedure.  Ask your health care provider if the medicine prescribed to you requires you to avoid driving or using heavy machinery. Activity  Rest as told by your health care provider.  Avoid sitting for a long time without moving. Get up to take short walks every 1-2 hours. This is important to improve blood flow and breathing. Ask for help if you feel weak or unsteady.  Return to your normal activities as told by your health care provider. Ask your health care provider what activities are safe for you. General instructions   Watch for any blood in your urine. Call your health care provider if the amount of blood in your urine increases.  If you have a catheter: ? Follow instructions from your health care provider about taking care of your catheter and collection bag. ? Do not take baths, swim, or use a hot tub until your health care  provider approves. Ask your health care provider if you may take showers. You may only be allowed to take sponge baths.  Drink enough fluid to keep your urine pale yellow.  Do not use any products that contain nicotine or tobacco, such as cigarettes, e-cigarettes, and chewing tobacco. These can delay healing after surgery. If you need help quitting, ask your health care provider.  Keep all follow-up visits as told by your health care provider. This is important. Contact a health care provider if:  You have pain that gets worse or does not get better with medicine, especially pain when you urinate.  You have difficulty urinating.  You feel nauseous or you vomit repeatedly during a period of more than 2 days after the procedure. Get help right away if:  Your urine is dark red or has blood clots in it.  You are leaking urine (have incontinence).  The end of the stent comes out of your urethra.  You cannot urinate.  You have sudden, sharp, or severe pain in your abdomen or lower back.  You have a fever.  You have swelling or pain in your legs.  You have difficulty breathing. Summary  After the procedure, it is common to have mild pain when you urinate that goes away within a few minutes after you urinate. This may last for up to 1 week.  Watch for any blood in your urine. Call your health care provider if the amount of blood in your urine increases.  Take over-the-counter and prescription medicines only as told by your health care provider.  Drink enough fluid to keep your urine pale yellow. This information is not intended to replace advice given to you by your health care provider. Make sure you discuss any questions you have with your health care provider. Document Revised: 05/25/2018 Document Reviewed: 05/26/2018 Elsevier Patient Education  2020 [employer].   Post Anesthesia Home Care Instructions  Activity: Get plenty of rest for the remainder of the day. A  responsible adult should stay with you for 24 hours following the procedure.  For the next 24 hours, DO NOT: -Drive a car -Advertising copywriter -Drink alcoholic beverages -Take any medication unless instructed by your physician -Make any legal decisions or sign important papers.  Meals: Start with liquid foods such as gelatin or soup. Progress to regular foods as tolerated. Avoid greasy, spicy, heavy foods. If nausea and/or vomiting occur, drink only clear liquids until the nausea and/or vomiting subsides. Call your physician if vomiting continues.  Special Instructions/Symptoms: Your throat may feel dry or sore from the anesthesia or the breathing tube placed in your throat during surgery. If this causes discomfort, gargle with warm salt water. The discomfort should disappear within 24 hours.  If you had a scopolamine patch placed behind your ear for the management of post- operative nausea and/or vomiting:  1. The medication in the patch is effective for 72 hours, after which it should be removed.  Wrap patch in a tissue and discard in the trash. Wash hands thoroughly with soap and water. 2. You may remove the patch earlier than 72 hours if you experience unpleasant side effects which may include dry mouth, dizziness or visual disturbances. 3. Avoid touching the patch. Wash your hands with soap and water after contact with the patch.

## 2020-04-03 NOTE — Interval H&P Note (Signed)
History and Physical Interval Note:  04/03/2020 1:30 PM  Karla Scott  has presented today for surgery, with the diagnosis of BILATERAL URETERAL STONES.  The various methods of treatment have been discussed with the patient and family. After consideration of risks, benefits and other options for treatment, the patient has consented to  Procedure(s): CYSTOSCOPY/URETEROSCOPY/HOLMIUM LASER/STENT PLACEMENT (Bilateral) HOLMIUM LASER APPLICATION as a surgical intervention.  The patient's history has been reviewed, patient examined, no change in status, stable for surgery.  I have reviewed the patient's chart and labs.  Questions were answered to the patient's satisfaction.  She is tolerating stents. No dysuria or fever. Some hematuria. Discussed exchange but with strings for quick removal. Urine cx from office was negative.     Jerilee Field

## 2020-04-03 NOTE — Anesthesia Preprocedure Evaluation (Signed)
Anesthesia Evaluation  Patient identified by MRN, date of birth, ID band Patient awake    Reviewed: Allergy & Precautions, H&P , NPO status , Patient's Chart, lab work & pertinent test results  Airway Mallampati: II  TM Distance: >3 FB Neck ROM: full    Dental  (+) Dental Advisory Given, Teeth Intact, Caps   Pulmonary Current Smoker,    breath sounds clear to auscultation       Cardiovascular + dysrhythmias  Rhythm:Regular Rate:Normal  H/o paroxysmal SVT   Neuro/Psych PSYCHIATRIC DISORDERS Anxiety Depression  Neuromuscular disease    GI/Hepatic   Endo/Other    Renal/GU Renal diseasestones     Musculoskeletal  (+) Arthritis , Fibromyalgia -  Abdominal   Peds  Hematology   Anesthesia Other Findings   Reproductive/Obstetrics                             Anesthesia Physical  Anesthesia Plan  ASA: II  Anesthesia Plan: General   Post-op Pain Management:    Induction: Intravenous  PONV Risk Score and Plan: 2 and Ondansetron, Dexamethasone, Midazolam and Treatment may vary due to age or medical condition  Airway Management Planned: LMA  Additional Equipment: None  Intra-op Plan:   Post-operative Plan: Extubation in OR  Informed Consent: I have reviewed the patients History and Physical, chart, labs and discussed the procedure including the risks, benefits and alternatives for the proposed anesthesia with the patient or authorized representative who has indicated his/her understanding and acceptance.     Dental advisory given  Plan Discussed with: CRNA  Anesthesia Plan Comments:         Anesthesia Quick Evaluation

## 2020-04-03 NOTE — Anesthesia Postprocedure Evaluation (Signed)
Anesthesia Post Note  Patient: Karla Scott  Procedure(s) Performed: CYSTOSCOPY/URETEROSCOPY/HOLMIUM LASER/STENT PLACEMENT (Bilateral Renal) HOLMIUM LASER APPLICATION (Bilateral Renal)     Patient location during evaluation: PACU Anesthesia Type: General Level of consciousness: awake Pain management: pain level controlled Vital Signs Assessment: post-procedure vital signs reviewed and stable Respiratory status: spontaneous breathing Cardiovascular status: stable Postop Assessment: no apparent nausea or vomiting Anesthetic complications: no   No complications documented.  Last Vitals:  Vitals:   04/03/20 1530 04/03/20 1550  BP: 123/78 127/78  Pulse: 83 88  Resp: 17 16  Temp:  36.7 C  SpO2: 93% 93%    Last Pain:  Vitals:   04/03/20 1550  TempSrc:   PainSc: 0-No pain   Pain Goal: Patients Stated Pain Goal: 5 (04/03/20 1142)                 Caren Macadam

## 2020-04-03 NOTE — Transfer of Care (Signed)
Immediate Anesthesia Transfer of Care Note  Patient: Karla Scott  Procedure(s) Performed: Procedure(s) (LRB): CYSTOSCOPY/URETEROSCOPY/HOLMIUM LASER/STENT PLACEMENT (Bilateral) HOLMIUM LASER APPLICATION (Bilateral)  Patient Location: PACU  Anesthesia Type: General  Level of Consciousness: awake, sedated, patient cooperative and responds to stimulation  Airway & Oxygen Therapy: Patient Spontanous Breathing and Patient connected to [place] 02 and soft FM   Post-op Assessment: Report given to PACU RN, Post -op Vital signs reviewed and stable and Patient moving all extremities  Post vital signs: Reviewed and stable  Complications: No apparent anesthesia complications

## 2020-04-04 ENCOUNTER — Encounter (HOSPITAL_BASED_OUTPATIENT_CLINIC_OR_DEPARTMENT_OTHER): Payer: Self-pay | Admitting: Urology

## 2021-05-24 ENCOUNTER — Ambulatory Visit (HOSPITAL_COMMUNITY)
Admission: AD | Admit: 2021-05-24 | Discharge: 2021-05-24 | Disposition: A | Discharge status: Home / Self Care | Payer: BC Managed Care – PPO | Source: Ambulatory Visit | Attending: Urology | Admitting: Urology

## 2021-05-24 ENCOUNTER — Inpatient Hospital Stay (HOSPITAL_COMMUNITY): Payer: BC Managed Care – PPO | Admitting: Certified Registered Nurse Anesthetist

## 2021-05-24 ENCOUNTER — Encounter (HOSPITAL_COMMUNITY): Payer: Self-pay | Admitting: Urology

## 2021-05-24 ENCOUNTER — Inpatient Hospital Stay (HOSPITAL_COMMUNITY): Payer: BC Managed Care – PPO

## 2021-05-24 ENCOUNTER — Other Ambulatory Visit: Payer: Self-pay

## 2021-05-24 ENCOUNTER — Other Ambulatory Visit: Payer: Self-pay | Admitting: Urology

## 2021-05-24 ENCOUNTER — Encounter (HOSPITAL_COMMUNITY)
Admission: AD | Disposition: A | Discharge status: Home / Self Care | Payer: Self-pay | Source: Ambulatory Visit | Attending: Urology

## 2021-05-24 DIAGNOSIS — Z20822 Contact with and (suspected) exposure to covid-19: Secondary | ICD-10-CM | POA: Insufficient documentation

## 2021-05-24 DIAGNOSIS — Z79891 Long term (current) use of opiate analgesic: Secondary | ICD-10-CM | POA: Insufficient documentation

## 2021-05-24 DIAGNOSIS — Z882 Allergy status to sulfonamides status: Secondary | ICD-10-CM | POA: Insufficient documentation

## 2021-05-24 DIAGNOSIS — N132 Hydronephrosis with renal and ureteral calculous obstruction: Secondary | ICD-10-CM | POA: Insufficient documentation

## 2021-05-24 DIAGNOSIS — N200 Calculus of kidney: Secondary | ICD-10-CM

## 2021-05-24 DIAGNOSIS — Z79899 Other long term (current) drug therapy: Secondary | ICD-10-CM | POA: Diagnosis not present

## 2021-05-24 DIAGNOSIS — F17200 Nicotine dependence, unspecified, uncomplicated: Secondary | ICD-10-CM | POA: Diagnosis not present

## 2021-05-24 DIAGNOSIS — N201 Calculus of ureter: Secondary | ICD-10-CM

## 2021-05-24 HISTORY — PX: CYSTOSCOPY W/ URETERAL STENT PLACEMENT: SHX1429

## 2021-05-24 LAB — SARS CORONAVIRUS 2 BY RT PCR (HOSPITAL ORDER, PERFORMED IN ~~LOC~~ HOSPITAL LAB): SARS Coronavirus 2: NEGATIVE

## 2021-05-24 SURGERY — CYSTOSCOPY, WITH RETROGRADE PYELOGRAM AND URETERAL STENT INSERTION
Anesthesia: General | Site: Ureter | Laterality: Bilateral

## 2021-05-24 MED ORDER — MIDAZOLAM HCL 5 MG/5ML IJ SOLN
INTRAMUSCULAR | Status: DC | PRN
  Administered 2021-05-24: 2 mg via INTRAVENOUS

## 2021-05-24 MED ORDER — OXYCODONE HCL 5 MG PO TABS: 5.0000 mg | ORAL_TABLET | Freq: Once | ORAL | Status: DC | PRN

## 2021-05-24 MED ORDER — IOHEXOL 300 MG/ML  SOLN
INTRAMUSCULAR | Status: DC | PRN
  Administered 2021-05-24: 10 mL via URETHRAL

## 2021-05-24 MED ORDER — MIDAZOLAM HCL 2 MG/2ML IJ SOLN
INTRAMUSCULAR | Status: AC
  Filled 2021-05-24: qty 2

## 2021-05-24 MED ORDER — CEFAZOLIN SODIUM-DEXTROSE 2-4 GM/100ML-% IV SOLN
INTRAVENOUS | Status: AC
  Filled 2021-05-24: qty 100

## 2021-05-24 MED ORDER — ONDANSETRON HCL 4 MG/2ML IJ SOLN
INTRAMUSCULAR | Status: AC
  Filled 2021-05-24: qty 2

## 2021-05-24 MED ORDER — ONDANSETRON HCL 4 MG/2ML IJ SOLN: 4.0000 mg | Freq: Four times a day (QID) | INTRAMUSCULAR | Status: DC | PRN

## 2021-05-24 MED ORDER — SODIUM CHLORIDE 0.9 % IR SOLN
Status: DC | PRN
  Administered 2021-05-24: 3000 mL

## 2021-05-24 MED ORDER — 0.9 % SODIUM CHLORIDE (POUR BTL) OPTIME
TOPICAL | Status: DC | PRN
  Administered 2021-05-24: 1000 mL

## 2021-05-24 MED ORDER — FENTANYL CITRATE (PF) 100 MCG/2ML IJ SOLN
INTRAMUSCULAR | Status: DC | PRN
  Administered 2021-05-24: 100 ug via INTRAVENOUS

## 2021-05-24 MED ORDER — PROPOFOL 10 MG/ML IV BOLUS
INTRAVENOUS | Status: DC | PRN
  Administered 2021-05-24: 120 mg via INTRAVENOUS

## 2021-05-24 MED ORDER — DEXAMETHASONE SODIUM PHOSPHATE 10 MG/ML IJ SOLN
INTRAMUSCULAR | Status: DC | PRN
Start: 2021-05-24 — End: 2021-05-24
  Administered 2021-05-24: 10 mg via INTRAVENOUS

## 2021-05-24 MED ORDER — PHENYLEPHRINE HCL (PRESSORS) 10 MG/ML IV SOLN
INTRAVENOUS | Status: DC | PRN
  Administered 2021-05-24 (×2): 80 ug via INTRAVENOUS

## 2021-05-24 MED ORDER — PHENYLEPHRINE 40 MCG/ML (10ML) SYRINGE FOR IV PUSH (FOR BLOOD PRESSURE SUPPORT)
PREFILLED_SYRINGE | INTRAVENOUS | Status: AC
  Filled 2021-05-24: qty 10

## 2021-05-24 MED ORDER — DEXAMETHASONE SODIUM PHOSPHATE 10 MG/ML IJ SOLN
INTRAMUSCULAR | Status: AC
  Filled 2021-05-24: qty 1

## 2021-05-24 MED ORDER — LIDOCAINE 2% (20 MG/ML) 5 ML SYRINGE
INTRAMUSCULAR | Status: DC | PRN
  Administered 2021-05-24: 100 mg via INTRAVENOUS

## 2021-05-24 MED ORDER — FENTANYL CITRATE PF 50 MCG/ML IJ SOSY: 25.0000 ug | PREFILLED_SYRINGE | INTRAMUSCULAR | Status: DC | PRN

## 2021-05-24 MED ORDER — CHLORHEXIDINE GLUCONATE 0.12 % MT SOLN
15.0000 mL | Freq: Once | OROMUCOSAL | Status: AC
  Administered 2021-05-24: 15 mL via OROMUCOSAL

## 2021-05-24 MED ORDER — CEFAZOLIN SODIUM-DEXTROSE 2-4 GM/100ML-% IV SOLN
2.0000 g | INTRAVENOUS | Status: AC
  Administered 2021-05-24: 2 g via INTRAVENOUS

## 2021-05-24 MED ORDER — FENTANYL CITRATE (PF) 250 MCG/5ML IJ SOLN
INTRAMUSCULAR | Status: AC
  Filled 2021-05-24: qty 5

## 2021-05-24 MED ORDER — ONDANSETRON HCL 4 MG/2ML IJ SOLN
INTRAMUSCULAR | Status: DC | PRN
  Administered 2021-05-24: 4 mg via INTRAVENOUS

## 2021-05-24 MED ORDER — OXYCODONE HCL 5 MG/5ML PO SOLN
5.0000 mg | Freq: Once | ORAL | Status: DC | PRN
Start: 2021-05-24 — End: 2021-05-25

## 2021-05-24 MED ORDER — PROPOFOL 10 MG/ML IV BOLUS
INTRAVENOUS | Status: AC
  Filled 2021-05-24: qty 20

## 2021-05-24 MED ORDER — LACTATED RINGERS IV SOLN: INTRAVENOUS | Status: DC

## 2021-05-24 SURGICAL SUPPLY — 21 items
BAG URO CATCHER STRL LF (MISCELLANEOUS) ×2 IMPLANT
BASKET ZERO TIP NITINOL 2.4FR (BASKET) IMPLANT
CATH URET 5FR 28IN OPEN ENDED (CATHETERS) ×2 IMPLANT
CLOTH BEACON ORANGE TIMEOUT ST (SAFETY) ×2 IMPLANT
EXTRACTOR STONE 1.7FRX115CM (UROLOGICAL SUPPLIES) IMPLANT
GLOVE SURG ENC TEXT LTX SZ7.5 (GLOVE) ×2 IMPLANT
GLOVE SURG UNDER POLY LF SZ6 (GLOVE) ×2 IMPLANT
GOWN STRL REUS W/TWL LRG LVL3 (GOWN DISPOSABLE) ×2 IMPLANT
GOWN STRL REUS W/TWL XL LVL3 (GOWN DISPOSABLE) ×2 IMPLANT
GUIDEWIRE ANG ZIPWIRE 038X150 (WIRE) IMPLANT
GUIDEWIRE STR DUAL SENSOR (WIRE) ×2 IMPLANT
IV NS IRRIG 3000ML ARTHROMATIC (IV SOLUTION) ×2 IMPLANT
KIT TURNOVER KIT A (KITS) ×2 IMPLANT
MANIFOLD NEPTUNE II (INSTRUMENTS) ×2 IMPLANT
NS IRRIG 1000ML POUR BTL (IV SOLUTION) ×2 IMPLANT
PACK CYSTO (CUSTOM PROCEDURE TRAY) ×2 IMPLANT
SHEATH URETERAL 12FRX28CM (UROLOGICAL SUPPLIES) IMPLANT
SHEATH URETERAL 12FRX35CM (MISCELLANEOUS) IMPLANT
STENT URET 6FRX24 CONTOUR (STENTS) ×4 IMPLANT
TUBING CONNECTING 10 (TUBING) ×2 IMPLANT
TUBING UROLOGY SET (TUBING) IMPLANT

## 2021-05-24 NOTE — Transfer of Care (Signed)
Immediate Anesthesia Transfer of Care Note  Patient: Karla Scott  Procedure(s) Performed: CYSTOSCOPY WITH RETROGRADE PYELOGRAM/URETERAL STENT PLACEMENT (Bilateral: Ureter)  Patient Location: PACU  Anesthesia Type:General  Level of Consciousness: awake, alert , oriented and patient cooperative  Airway & Oxygen Therapy: Patient Spontanous Breathing and Patient connected to face mask oxygen  Post-op Assessment: Report given to RN, Post -op Vital signs reviewed and stable and Patient moving all extremities X 4  Post vital signs: stable  Last Vitals:  Vitals Value Taken Time  BP 107/58 05/24/21 1846  Temp    Pulse 101 05/24/21 1849  Resp 23 05/24/21 1849  SpO2 100 % 05/24/21 1849  Vitals shown include unvalidated device data.  Last Pain:  Vitals:   05/24/21 1630  TempSrc: Oral  PainSc: 4          Complications: No notable events documented.

## 2021-05-24 NOTE — H&P (Signed)
03/22/2020: 55 year old female with a past medical history of urolithiasis. She has required surgical stone management including URS, Stents and lithotripsy. She has also passed multiple stones on her own. She presents today with right-sided flank pain that radiates across her lower back associated with nausea, gross hematuria, and chills. The pain is intermittent and began 1 week ago. She has taken half of the hydrocodone and Zofran that she had left over from previous stone surgery in 2019. She reports this is controlling her pain and nausea. She is making urine and urinating without difficulty. She denies fevers, and vomiting.   05/24/2021: 55 year old female who presents today for concerns of bilateral flank pain. She reports her pain initially began on the right side and she did passed 2 small stone fragments as well as a little bit of blood, this began 3 days ago. Her pain is still persistent on the right side and intermittent. She also reports left-sided pain that started last night as well as nausea, and fevers of 103.0 last night. She last ate yesterday. Last drink was at 11 am-water.     ALLERGIES: Sulfa - Hives    MEDICATIONS: Zyrtec  Cymbalta 60 mg capsule,delayed release  Hydrocodone-Acetaminophen 5 mg-325 mg tablet  Ibuprofen  Oxycodone Hcl 5 mg tablet 1-2 tablet PO Q 6 H PRN for severe pain  Stool Softener 1 PO Daily PRN  Trazodone Hcl 150 mg tablet  Xanax 0.25 mg tablet  Zofran 4 mg tablet     GU PSH: Cysto Uretero Biopsy Fulgura, Left - 2019 Cystoscopy Insert Stent, Bilateral - 03/23/2020, Left - 2019, Right - 2017 Ureteroscopic laser litho, Bilateral - 04/03/2020, Left - 2019, Right - 2017     NON-GU PSH: Treat Trigeminal Nerve     GU PMH: Urinary Tract Inf, Unspec site - 2019 Flank Pain - 2019, (Acute), Left, Secondary to proximal ureteral stone. , - 2019, - 2018 Renal calculus - 2019, - 2018 Acute Cystitis/UTI - 2019 Ureteral obstruction secondary to calculous  (Acute), Left, Secondary to proximal ureteral stone. - 2019 Ureteral calculus - 2017    NON-GU PMH: Fever, unspecified (Acute), Ceftriaxone 1 GM IM today. Culture urine. - 2019 Anxiety Arthritis Depression    FAMILY HISTORY: 2 sons - Son Kidney Stones - Runs in Family stroke - Father   SOCIAL HISTORY: Marital Status: Married Preferred Language: English; Ethnicity: Not Hispanic Or Latino; Race: White Current Smoking Status: Patient smokes. Has smoked since 12/30/1997. Smokes 1/2 pack per day.   Tobacco Use Assessment Completed: Used Tobacco in last 30 days? Does not use smokeless tobacco. Types of alcohol consumed: Wine. Social Drinker.  Does not use drugs. Drinks 1 caffeinated drink per day. Has not had a blood transfusion. Patient's occupation Community education officer.    REVIEW OF SYSTEMS:    GU Review Female:   Patient reports frequent urination, hard to postpone urination, and burning /pain with urination. Patient denies get up at night to urinate, leakage of urine, stream starts and stops, trouble starting your stream, have to strain to urinate, and being pregnant.  Gastrointestinal (Upper):   Patient reports nausea. Patient denies vomiting and indigestion/ heartburn.  Gastrointestinal (Lower):   Patient denies diarrhea and constipation.  Constitutional:   Patient reports fever and fatigue. Patient denies night sweats and weight loss.  Skin:   Patient denies skin rash/ lesion and itching.  Musculoskeletal:   Patient denies back pain and joint pain.  Neurological:   Patient denies headaches and dizziness.  Psychologic:  Patient denies depression and anxiety.   Notes: bilateral flank pain    VITAL SIGNS:      05/24/2021 02:05 PM  BP 126/58 mmHg  Pulse 110 /min  Temperature 97.9 F / 36.6 C   GU PHYSICAL EXAMINATION:      Notes: Right CVAT.    MULTI-SYSTEM PHYSICAL EXAMINATION:    Constitutional: Well-nourished. No physical deformities. Normally developed. Good  grooming.  Cardiovascular: Normal temperature, normal extremity pulses, no swelling, no varicosities.  Skin: No paleness, no jaundice, no cyanosis. No lesion, no ulcer, no rash.  Neurologic / Psychiatric: Oriented to time, oriented to place, oriented to person. No depression, no anxiety, no agitation.  Gastrointestinal: No mass, no tenderness, no rigidity, non obese abdomen.     Complexity of Data:  Source Of History:  Patient, Medical Record Summary  Records Review:   Previous Doctor Records, Previous Hospital Records, Previous Patient Records  Urine Test Review:   Urinalysis, Urine Culture  X-Ray Review: C.T. Stone Protocol: Reviewed Films. Reviewed Report. Discussed With Patient.     05/24/21  Urinalysis  Urine Appearance Cloudy   Urine Color Amber   Urine Glucose Neg mg/dL  Urine Bilirubin Neg mg/dL  Urine Ketones Neg mg/dL  Urine Specific Gravity 1.025   Urine Blood 3+ ery/uL  Urine pH 5.5   Urine Protein 1+ mg/dL  Urine Urobilinogen 1.0 mg/dL  Urine Nitrites Positive   Urine Leukocyte Esterase 3+ leu/uL  Urine WBC/hpf 40 - 60/hpf   Urine RBC/hpf 20 - 40/hpf   Urine Epithelial Cells 0 - 5/hpf   Urine Bacteria Mod (26-50/hpf)   Urine Mucous Not Present   Urine Yeast NS (Not Seen)   Urine Trichomonas Not Present   Urine Cystals NS (Not Seen)   Urine Casts NS (Not Seen)   Urine Sperm Not Present    PROCEDURES:         C.T. Urogram - [id number]      Patient confirmed No Neulasta OnPro Device.         Urinalysis w/Scope Dipstick Dipstick Cont'd Micro  Color: Amber Bilirubin: Neg mg/dL WBC/hpf: 40 - 78/EUM  Appearance: Cloudy Ketones: Neg mg/dL RBC/hpf: 20 - 35/TIR  Specific Gravity: 1.025 Blood: 3+ ery/uL Bacteria: Mod (26-50/hpf)  pH: 5.5 Protein: 1+ mg/dL Cystals: NS (Not Seen)  Glucose: Neg mg/dL Urobilinogen: 1.0 mg/dL Casts: NS (Not Seen)    Nitrites: Positive Trichomonas: Not Present    Leukocyte Esterase: 3+ leu/uL Mucous: Not Present      Epithelial Cells: 0 -  5/hpf      Yeast: NS (Not Seen)      Sperm: Not Present         Ceftriaxone 1g - 44315, Q0086 Pt was prepped and cleaned. Ceftriaxone 1g was injected IM in the right buttock. A band-aid was applied. Pt tolerated procedure well.    Qty: 1 Adm. By: Jackolyn Confer  Unit: gram Lot No [id number]  Route: IM Exp. Date 09/02/2023  Freq: None Mfgr.:   Site: Right Buttock         Ketoralac 60mg  - , [id number] The area was prepped and cleaned using sterile technique. A band aid was applied. The pt tolerated well.   Qty: 60 Adm. By: [id number]  Unit: mg Lot No Jackolyn Confer  Route: IM Exp. Date 03/01/2022  Freq: None Mfgr.:   Site: Right Buttock         Phenergan 25mg  - 05/02/2022, [id number] Pt was prepped and cleaned. Phenergan 25mg  was injected IM in  the left buttock. A band-aid was applied. Pt tolerated procedure well.    Qty: 25 Adm. By: Jackolyn Confer  Unit: mg Lot No [id number]  Route: IM Exp. Date 01/31/2023  Freq: None Mfgr.:   Site: None   ASSESSMENT:      ICD-10 Details  1 GU:   Acute Cystitis/UTI - N30.00 Acute, Systemic Symptoms  2   Ureteral calculus - N20.1 Right, Acute, Systemic Symptoms   PLAN:            Medications New Meds: Cipro 500 mg tablet 1 tablet PO BID   #20  0 Refill(s)            Orders Labs CULTURE, URINE  X-Rays: C.T. Stone Protocol Without I.V. Contrast - feveers, gross hematuria, bilateral flank pain, HX of bilateral obstructing stones.   X-Ray Notes: History:  Hematuria: Yes/No  Patient to see MD after exam: Yes/No  Previous exam: CT / IVP/ US/ KUB/ None  When:  Where:  Diabetic: Yes/ No  BUN/ Creatinine:  Date of last BUN Creatinine:  Weight in pounds:  Allergy- IV Contrast: Yes/ No  Conflicting diabetic meds: Yes/ No  Diabetic Meds:  Prior Authorization #Yetta Numbers #[id number] Valid 05/24/21 thru 06/22/21            Schedule Procedure: 05/24/2021 at [facility] - [phone] - Phenergan 25mg   (Phenergan Per 50 Mg) - [id number], [phone]  Procedure: 05/24/2021 at [facility] - [phone] - Ketoralac 60mg  (Toradol Per 15 Mg) - 79024,          Document Letter(s):  Created for Patient: Clinical Summary         Notes:   Urinalysis is concerning for an infection today. I will begin empirical treatment with 1 g of IM Rocephin. CT scan was obtained today due to concern for obstructing stone. This shows severe hydronephrosis of her right kidney and a distal 5-6 mm right obstructing stone. She has 2 very large left-sided stones within the renal pelvis measuring 11 mm each. This is causing some pelvic fullness. In addition to 1 g of Rocephin I also gave her 25 mg of Phenergan and 60 of Toradol for pain. She will need to undergo emergent stent placement bilaterally this evening. This was discussed with on-call physicia who agreed. I will send in pain and nausea medicine for her as well as Flomax. She will then need follow-up for definitive stone management which will likely be staged.

## 2021-05-24 NOTE — Op Note (Signed)
Preoperative diagnosis:  Right obstructing stone and fever Large left renal pelvic stones Pyelonephritis  Postoperative diagnosis:  Same  Procedure:  Cystoscopy bilateral ureteral stent placement bilateral retrograde pyelography with interpretation   Surgeon: Crist Fat, MD  Anesthesia: General  Complications: None  Intraoperative findings:   #1:  Right retrograde pyelography demonstrated a filling defect within the right ureter consistent with the patient's known calculus without other abnormalities. 2.:  The patient's right retrograde pyelogram was normal up into the renal pelvis where there was a small filling defect at the UPJ and associated hydronephrosis. 3.:  24 cm x 6 French double-J ureteral stents were placed.   EBL: Minimal  Specimens: None  Indication: Karla Scott is a 55 y.o. patient with history of kidney stones who presented to the office today with fever and bilateral flank pain.  A CT scan demonstrated a large obstructing stone in the right kidney and bilateral nonobstructing stones with some left-sided hydronephrosis.  Urine analysis was concerning for infection.  After reviewing the management options for treatment, he elected to proceed with the above surgical procedure(s). We have discussed the potential benefits and risks of the procedure, side effects of the proposed treatment, the likelihood of the patient achieving the goals of the procedure, and any potential problems that might occur during the procedure or recuperation. Informed consent has been obtained.  Description of procedure:  The patient was taken to the operating room and general anesthesia was induced.  The patient was placed in the dorsal lithotomy position, prepped and draped in the usual sterile fashion, and preoperative antibiotics were administered. A preoperative time-out was performed.   Cystourethroscopy was performed.  The patient's urethra was examined and was normal . The  bladder was then systematically examined in its entirety. There was no evidence for any bladder tumors, stones, or other mucosal pathology.    Attention then turned to the rightureteral orifice and a ureteral catheter was used to intubate the ureteral orifice.  Omnipaque contrast was injected through the ureteral catheter and a retrograde pyelogram was performed with findings as dictated above.  A 0.38 sensor guidewire was then advanced up the right ureter into the renal pelvis under fluoroscopic guidance.  The wire was then backloaded through the cystoscope and a ureteral stent was advance over the wire using Seldinger technique.  The stent was positioned appropriately under fluoroscopic and cystoscopic guidance.  The wire was then removed with an adequate stent curl noted in the renal pelvis as well as in the bladder.  Attention was then turned to the left ureteral orifice and a similar procedure was performed formed on the left side ultimately with a left 24 cm time 6 French double-J ureteral stent being placed.  The bladder was then emptied and the procedure ended.  The patient appeared to tolerate the procedure well and without complications.  The patient was able to be awakened and transferred to the recovery unit in satisfactory condition.    Crist Fat, M.D.

## 2021-05-24 NOTE — Discharge Instructions (Signed)
DISCHARGE INSTRUCTIONS FOR KIDNEY STONE/URETERAL STENT   MEDICATIONS:  1.  Resume all your other meds from home - except do not take any extra narcotic pain meds that you may have at home.  2.  Cipro and Oxycodone have been called into the pharmacy for you from the [facility]  ACTIVITY:  1. No strenuous activity x 1week  2. No driving while on narcotic pain medications  3. Drink plenty of water  4. Continue to walk at home - you can still get blood clots when you are at home, so keep active, but don't over do it.  5. May return to work/school tomorrow or when you feel ready   BATHING:  1. You can shower and we recommend daily showers  2. You have a string coming from your urethra: The stent string is attached to your ureteral stent. Do not pull on this.   SIGNS/SYMPTOMS TO CALL:  Please call us if you have a fever greater than 101.5, uncontrolled nausea/vomiting, uncontrolled pain, dizziness, unable to urinate, bloody urine, chest pain, shortness of breath, leg swelling, leg pain, redness around wound, drainage from wound, or any other concerns or questions.   You can reach [place] at [phone].   FOLLOW-UP:  1. Someone from Dr. Greer Ee office will contact you with a follow-up appointment

## 2021-05-24 NOTE — Anesthesia Procedure Notes (Signed)
Procedure Name: Intubation Date/Time: 05/24/2021 6:21 PM Performed by: Illene Silver, CRNA Pre-anesthesia Checklist: Patient identified, Emergency Drugs available, Suction available and Patient being monitored Patient Re-evaluated:Patient Re-evaluated prior to induction Oxygen Delivery Method: Circle system utilized Preoxygenation: Pre-oxygenation with 100% oxygen Induction Type: IV induction Ventilation: Mask ventilation without difficulty LMA: LMA with gastric port inserted LMA Size: 4.0 Tube type: Oral Number of attempts: 1 Airway Equipment and Method: Oral airway Placement Confirmation: positive ETCO2 Tube secured with: Tape Dental Injury: Teeth and Oropharynx as per pre-operative assessment

## 2021-05-24 NOTE — Anesthesia Preprocedure Evaluation (Signed)
Anesthesia Evaluation  Patient identified by MRN, date of birth, ID band Patient awake    Reviewed: Allergy & Precautions, H&P , NPO status , Patient's Chart, lab work & pertinent test results  Airway Mallampati: II   Neck ROM: full    Dental   Pulmonary Current Smoker,    breath sounds clear to auscultation       Cardiovascular negative cardio ROS   Rhythm:regular Rate:Normal     Neuro/Psych PSYCHIATRIC DISORDERS Anxiety Depression  Neuromuscular disease    GI/Hepatic   Endo/Other    Renal/GU stones     Musculoskeletal   Abdominal   Peds  Hematology   Anesthesia Other Findings   Reproductive/Obstetrics                             Anesthesia Physical Anesthesia Plan  ASA: 2  Anesthesia Plan: General   Post-op Pain Management:    Induction: Intravenous  PONV Risk Score and Plan: 2 and Ondansetron, Dexamethasone, Midazolam and Treatment may vary due to age or medical condition  Airway Management Planned: LMA  Additional Equipment:   Intra-op Plan:   Post-operative Plan: Extubation in OR  Informed Consent: I have reviewed the patients History and Physical, chart, labs and discussed the procedure including the risks, benefits and alternatives for the proposed anesthesia with the patient or authorized representative who has indicated his/her understanding and acceptance.     Dental advisory given  Plan Discussed with: CRNA, Anesthesiologist and Surgeon  Anesthesia Plan Comments:         Anesthesia Quick Evaluation

## 2021-05-26 ENCOUNTER — Encounter (HOSPITAL_COMMUNITY): Payer: Self-pay | Admitting: Urology

## 2021-05-26 NOTE — Anesthesia Postprocedure Evaluation (Signed)
Anesthesia Post Note  Patient: Karla Scott  Procedure(s) Performed: CYSTOSCOPY WITH RETROGRADE PYELOGRAM/URETERAL STENT PLACEMENT (Bilateral: Ureter)     Patient location during evaluation: PACU Anesthesia Type: General Level of consciousness: awake and alert Pain management: pain level controlled Vital Signs Assessment: post-procedure vital signs reviewed and stable Respiratory status: spontaneous breathing, nonlabored ventilation, respiratory function stable and patient connected to nasal cannula oxygen Cardiovascular status: blood pressure returned to baseline and stable Postop Assessment: no apparent nausea or vomiting Anesthetic complications: no   No notable events documented.  Last Vitals:  Vitals:   05/24/21 1900 05/24/21 1915  BP: 96/62 (!) 97/51  Pulse: 98 93  Resp: (!) 25 16  Temp:    SpO2: 97% 95%    Last Pain:  Vitals:   05/24/21 1915  TempSrc:   PainSc: 0-No pain                 Alesia Oshields S

## 2021-06-06 ENCOUNTER — Other Ambulatory Visit: Payer: Self-pay | Admitting: Urology

## 2021-06-14 NOTE — Progress Notes (Addendum)
COVID swab appointment: n/a  COVID Vaccine Completed: yes x2 Date COVID Vaccine completed: Has received booster: COVID vaccine manufacturer:Moderna     Date of COVID positive in last 90 days: no  PCP - Kirstie Peri, MD Cardiologist - n/a  Chest x-ray -  EKG -  Stress Test -  ECHO - 'awhile ago' per pt for rapid heartbeat Cardiac Cath - no Pacemaker/ICD device last checked: n/a Spinal Cord Stimulator: n/a  Sleep Study - n/a CPAP -   Fasting Blood Sugar - n/a Checks Blood Sugar _____ times a day  Blood Thinner Instructions: n/a Aspirin Instructions: Last Dose:  Activity level: Can go up a flight of stairs and perform activities of daily living without stopping and without symptoms of chest pain or shortness of breath.       Anesthesia review:   Patient denies shortness of breath, fever, cough and chest pain at PAT appointment   Patient verbalized understanding of instructions that were given to them at the PAT appointment. Patient was also instructed that they will need to review over the PAT instructions again at home before surgery.

## 2021-06-14 NOTE — Patient Instructions (Addendum)
DUE TO COVID-19 ONLY ONE VISITOR IS ALLOWED TO COME WITH YOU AND STAY IN THE WAITING ROOM ONLY DURING PRE OP AND PROCEDURE.   **NO VISITORS ARE ALLOWED IN THE SHORT STAY AREA OR RECOVERY ROOM!!**       Your procedure is scheduled on: 06/25/21   Report to [facility] Main Entrance    Report to admitting at 12:00 PM   Call this number if you have problems the morning of surgery [phone]   Follow clear liquid diet the day before surgery. Please follow the instructions given to you by surgeons office   May have liquids until 11:15 AM day of surgery  CLEAR LIQUID DIET  Foods Allowed                                                                     Foods Excluded  Water, Black Coffee and tea (no milk or creamer)          liquids that you cannot  Plain Jell-O in any flavor  (No red)                                   see through such as: Fruit ices (not with fruit pulp)                                           milk, soups, orange juice              Iced Popsicles (No red)                                               All solid food                                   Apple juices Sports drinks like Gatorade (No red) Lightly seasoned clear broth or consume(fat free) Sugar  Sample Menu Breakfast                                Lunch                                     Supper Cranberry juice                    Beef broth                            Chicken broth Jell-O                                     Grape juice  Apple juice Coffee or tea                        Jell-O                                      Popsicle                                                Coffee or tea                        Coffee or tea       Oral Hygiene is also important to reduce your risk of infection.                                    Remember - BRUSH YOUR TEETH THE MORNING OF SURGERY WITH YOUR REGULAR TOOTHPASTE   Do NOT smoke after Midnight   Take these medicines  the morning of surgery with A SIP OF WATER: Xanax, Cymbalta, Norco                              You may not have any metal on your body including hair pins, jewelry, and body piercing             Do not wear make-up, lotions, powders, perfumes, or deodorant  Do not wear nail polish including gel and S&S, artificial/acrylic nails, or any other type of covering on natural nails including finger and toenails. If you have artificial nails, gel coating, etc. that needs to be removed by a nail salon please have this removed prior to surgery or surgery may need to be canceled/ delayed if the surgeon/ anesthesia feels like they are unable to be safely monitored.   Do not shave  48 hours prior to surgery.    Do not bring valuables to the hospital. [facility] IS NOT             RESPONSIBLE   FOR VALUABLES.    Patients discharged on the day of surgery will not be allowed to drive home.   Please read over the following fact sheets you were given: IF YOU HAVE QUESTIONS ABOUT YOUR PRE-OP INSTRUCTIONS PLEASE CALL [phone]- [facility] - Preparing for Surgery Before surgery, you can play an important role.  Because skin is not sterile, your skin needs to be as free of germs as possible.  You can reduce the number of germs on your skin by washing with CHG (chlorahexidine gluconate) soap before surgery.  CHG is an antiseptic cleaner which kills germs and bonds with the skin to continue killing germs even after washing. Please DO NOT use if you have an allergy to CHG or antibacterial soaps.  If your skin becomes reddened/irritated stop using the CHG and inform your nurse when you arrive at Short Stay. Do not shave (including legs and underarms) for at least 48 hours prior to the first CHG shower.  You may shave your face/neck.  Please follow these instructions carefully:  1.  Shower with CHG Soap the night before  surgery and the  morning of surgery.  2.  If you choose to wash your hair, wash your  hair first as usual with your normal  shampoo.  3.  After you shampoo, rinse your hair and body thoroughly to remove the shampoo.                             4.  Use CHG as you would any other liquid soap.  You can apply chg directly to the skin and wash.  Gently with a scrungie or clean washcloth.  5.  Apply the CHG Soap to your body ONLY FROM THE NECK DOWN.   Do   not use on face/ open                           Wound or open sores. Avoid contact with eyes, ears mouth and   genitals (private parts).                       Wash face,  Genitals (private parts) with your normal soap.             6.  Wash thoroughly, paying special attention to the area where your    surgery  will be performed.  7.  Thoroughly rinse your body with warm water from the neck down.  8.  DO NOT shower/wash with your normal soap after using and rinsing off the CHG Soap.                9.  Pat yourself dry with a clean towel.            10.  Wear clean pajamas.            11.  Place clean sheets on your bed the night of your first shower and do not  sleep with pets. Day of Surgery : Do not apply any lotions/deodorants the morning of surgery.  Please wear clean clothes to the hospital/surgery center.  FAILURE TO FOLLOW THESE INSTRUCTIONS MAY RESULT IN THE CANCELLATION OF YOUR SURGERY  PATIENT SIGNATURE_________________________________  NURSE SIGNATURE__________________________________  ________________________________________________________________________

## 2021-06-17 ENCOUNTER — Other Ambulatory Visit: Payer: Self-pay

## 2021-06-17 ENCOUNTER — Encounter (HOSPITAL_COMMUNITY): Payer: Self-pay

## 2021-06-17 ENCOUNTER — Encounter (HOSPITAL_COMMUNITY)
Admission: RE | Admit: 2021-06-17 | Discharge: 2021-06-17 | Disposition: A | Discharge status: Home / Self Care | Payer: BC Managed Care – PPO | Source: Ambulatory Visit | Attending: Urology | Admitting: Urology

## 2021-06-17 DIAGNOSIS — Z01812 Encounter for preprocedural laboratory examination: Secondary | ICD-10-CM | POA: Diagnosis present

## 2021-06-17 LAB — CBC
HCT: 42.4 % (ref 36.0–46.0)
Hemoglobin: 13.9 g/dL (ref 12.0–15.0)
MCH: 32.5 pg (ref 26.0–34.0)
MCHC: 32.8 g/dL (ref 30.0–36.0)
MCV: 99.1 fL (ref 80.0–100.0)
Platelets: 286 10*3/uL (ref 150–400)
RBC: 4.28 MIL/uL (ref 3.87–5.11)
RDW: 12.8 % (ref 11.5–15.5)
WBC: 7.1 10*3/uL (ref 4.0–10.5)
nRBC: 0 % (ref 0.0–0.2)

## 2021-06-17 LAB — BASIC METABOLIC PANEL
Anion gap: 9 (ref 5–15)
BUN: 16 mg/dL (ref 6–20)
CO2: 25 mmol/L (ref 22–32)
Calcium: 9.1 mg/dL (ref 8.9–10.3)
Chloride: 106 mmol/L (ref 98–111)
Creatinine, Ser: 1.02 mg/dL — ABNORMAL HIGH (ref 0.44–1.00)
GFR, Estimated: >60 mL/min (ref >60)
Glucose, Bld: 104 mg/dL — ABNORMAL HIGH (ref 70–99)
Potassium: 3.9 mmol/L (ref 3.5–5.1)
Sodium: 140 mmol/L (ref 135–145)

## 2021-06-25 ENCOUNTER — Ambulatory Visit (HOSPITAL_COMMUNITY): Payer: BC Managed Care – PPO | Admitting: Registered Nurse

## 2021-06-25 ENCOUNTER — Ambulatory Visit (HOSPITAL_COMMUNITY)
Admission: RE | Admit: 2021-06-25 | Discharge: 2021-06-25 | Disposition: A | Discharge status: Home / Self Care | Payer: BC Managed Care – PPO | Source: Ambulatory Visit | Attending: Urology | Admitting: Urology

## 2021-06-25 ENCOUNTER — Other Ambulatory Visit: Payer: Self-pay

## 2021-06-25 ENCOUNTER — Encounter (HOSPITAL_COMMUNITY)
Admission: RE | Disposition: A | Discharge status: Home / Self Care | Payer: Self-pay | Source: Ambulatory Visit | Attending: Urology

## 2021-06-25 ENCOUNTER — Encounter (HOSPITAL_COMMUNITY): Payer: Self-pay | Admitting: Urology

## 2021-06-25 ENCOUNTER — Ambulatory Visit (HOSPITAL_COMMUNITY): Payer: BC Managed Care – PPO

## 2021-06-25 DIAGNOSIS — N202 Calculus of kidney with calculus of ureter: Secondary | ICD-10-CM | POA: Insufficient documentation

## 2021-06-25 DIAGNOSIS — N200 Calculus of kidney: Secondary | ICD-10-CM

## 2021-06-25 DIAGNOSIS — Z888 Allergy status to other drugs, medicaments and biological substances status: Secondary | ICD-10-CM | POA: Insufficient documentation

## 2021-06-25 DIAGNOSIS — Z79899 Other long term (current) drug therapy: Secondary | ICD-10-CM | POA: Insufficient documentation

## 2021-06-25 DIAGNOSIS — N201 Calculus of ureter: Secondary | ICD-10-CM

## 2021-06-25 DIAGNOSIS — Z882 Allergy status to sulfonamides status: Secondary | ICD-10-CM | POA: Diagnosis not present

## 2021-06-25 DIAGNOSIS — F17210 Nicotine dependence, cigarettes, uncomplicated: Secondary | ICD-10-CM | POA: Insufficient documentation

## 2021-06-25 HISTORY — PX: CYSTOSCOPY/URETEROSCOPY/HOLMIUM LASER/STENT PLACEMENT: SHX6546

## 2021-06-25 SURGERY — CYSTOSCOPY/URETEROSCOPY/HOLMIUM LASER/STENT PLACEMENT
Anesthesia: General | Site: Urethra | Laterality: Bilateral

## 2021-06-25 MED ORDER — CHLORHEXIDINE GLUCONATE 0.12 % MT SOLN
15.0000 mL | Freq: Once | OROMUCOSAL | Status: AC
  Administered 2021-06-25: 15 mL via OROMUCOSAL

## 2021-06-25 MED ORDER — DROPERIDOL 2.5 MG/ML IJ SOLN: 0.6250 mg | Freq: Once | INTRAMUSCULAR | Status: DC | PRN

## 2021-06-25 MED ORDER — CEFAZOLIN SODIUM-DEXTROSE 2-4 GM/100ML-% IV SOLN
2.0000 g | INTRAVENOUS | Status: AC
  Administered 2021-06-25: 2 g via INTRAVENOUS
  Filled 2021-06-25: qty 100

## 2021-06-25 MED ORDER — PHENYLEPHRINE 40 MCG/ML (10ML) SYRINGE FOR IV PUSH (FOR BLOOD PRESSURE SUPPORT)
PREFILLED_SYRINGE | INTRAVENOUS | Status: AC
  Filled 2021-06-25: qty 10

## 2021-06-25 MED ORDER — PHENYLEPHRINE 40 MCG/ML (10ML) SYRINGE FOR IV PUSH (FOR BLOOD PRESSURE SUPPORT)
PREFILLED_SYRINGE | INTRAVENOUS | Status: DC | PRN
  Administered 2021-06-25: 120 ug via INTRAVENOUS
  Administered 2021-06-25 (×4): 80 ug via INTRAVENOUS
  Administered 2021-06-25: 120 ug via INTRAVENOUS

## 2021-06-25 MED ORDER — OXYCODONE HCL 5 MG/5ML PO SOLN: 5.0000 mg | Freq: Once | ORAL | Status: DC | PRN

## 2021-06-25 MED ORDER — ONDANSETRON HCL 4 MG/2ML IJ SOLN
INTRAMUSCULAR | Status: AC
  Filled 2021-06-25: qty 2

## 2021-06-25 MED ORDER — LIDOCAINE HCL URETHRAL/MUCOSAL 2 % EX GEL
CUTANEOUS | Status: DC | PRN
  Administered 2021-06-25: 1 via URETHRAL

## 2021-06-25 MED ORDER — OXYCODONE HCL 5 MG PO TABS: 5.0000 mg | ORAL_TABLET | Freq: Once | ORAL | Status: DC | PRN

## 2021-06-25 MED ORDER — FENTANYL CITRATE (PF) 100 MCG/2ML IJ SOLN
INTRAMUSCULAR | Status: AC
  Filled 2021-06-25: qty 2

## 2021-06-25 MED ORDER — MIDAZOLAM HCL 2 MG/2ML IJ SOLN
INTRAMUSCULAR | Status: AC
  Filled 2021-06-25: qty 2

## 2021-06-25 MED ORDER — PROPOFOL 10 MG/ML IV BOLUS
INTRAVENOUS | Status: AC
  Filled 2021-06-25: qty 20

## 2021-06-25 MED ORDER — LACTATED RINGERS IV SOLN: INTRAVENOUS | Status: DC

## 2021-06-25 MED ORDER — MIDAZOLAM HCL 5 MG/5ML IJ SOLN
INTRAMUSCULAR | Status: DC | PRN
  Administered 2021-06-25: 2 mg via INTRAVENOUS

## 2021-06-25 MED ORDER — BELLADONNA ALKALOIDS-OPIUM 16.2-30 MG RE SUPP
RECTAL | Status: AC
  Filled 2021-06-25: qty 1

## 2021-06-25 MED ORDER — LIDOCAINE HCL (PF) 2 % IJ SOLN
INTRAMUSCULAR | Status: AC
  Filled 2021-06-25: qty 5

## 2021-06-25 MED ORDER — LIDOCAINE 2% (20 MG/ML) 5 ML SYRINGE
INTRAMUSCULAR | Status: DC | PRN
  Administered 2021-06-25: 60 mg via INTRAVENOUS

## 2021-06-25 MED ORDER — PROPOFOL 10 MG/ML IV BOLUS
INTRAVENOUS | Status: DC | PRN
  Administered 2021-06-25: 150 mg via INTRAVENOUS
  Administered 2021-06-25 (×2): 50 mg via INTRAVENOUS

## 2021-06-25 MED ORDER — DEXAMETHASONE SODIUM PHOSPHATE 10 MG/ML IJ SOLN
INTRAMUSCULAR | Status: DC | PRN
  Administered 2021-06-25: 8 mg via INTRAVENOUS

## 2021-06-25 MED ORDER — ORAL CARE MOUTH RINSE: 15.0000 mL | Freq: Once | OROMUCOSAL | Status: AC

## 2021-06-25 MED ORDER — SODIUM CHLORIDE 0.9 % IR SOLN
Status: DC | PRN
  Administered 2021-06-25: 6000 mL via INTRAVESICAL

## 2021-06-25 MED ORDER — ACETAMINOPHEN 500 MG PO TABS: 1000.0000 mg | ORAL_TABLET | Freq: Once | ORAL | Status: DC

## 2021-06-25 MED ORDER — PROMETHAZINE HCL 25 MG/ML IJ SOLN: 6.2500 mg | INTRAMUSCULAR | Status: DC | PRN

## 2021-06-25 MED ORDER — LIDOCAINE HCL URETHRAL/MUCOSAL 2 % EX GEL
CUTANEOUS | Status: AC
  Filled 2021-06-25: qty 30

## 2021-06-25 MED ORDER — DEXAMETHASONE SODIUM PHOSPHATE 10 MG/ML IJ SOLN
INTRAMUSCULAR | Status: AC
  Filled 2021-06-25: qty 1

## 2021-06-25 MED ORDER — ONDANSETRON HCL 4 MG/2ML IJ SOLN
INTRAMUSCULAR | Status: DC | PRN
  Administered 2021-06-25: 4 mg via INTRAVENOUS

## 2021-06-25 MED ORDER — FENTANYL CITRATE (PF) 100 MCG/2ML IJ SOLN
INTRAMUSCULAR | Status: DC | PRN
  Administered 2021-06-25 (×4): 50 ug via INTRAVENOUS

## 2021-06-25 MED ORDER — FENTANYL CITRATE PF 50 MCG/ML IJ SOSY: 25.0000 ug | PREFILLED_SYRINGE | INTRAMUSCULAR | Status: DC | PRN

## 2021-06-25 SURGICAL SUPPLY — 22 items
BAG URO CATCHER STRL LF (MISCELLANEOUS) ×2 IMPLANT
BASKET ZERO TIP NITINOL 2.4FR (BASKET) IMPLANT
BSKT STON RTRVL ZERO TP 2.4FR (BASKET)
CATH INTERMIT  6FR 70CM (CATHETERS) ×2 IMPLANT
CATH URET 5FR 28IN CONE TIP (BALLOONS)
CATH URET 5FR 70CM CONE TIP (BALLOONS) IMPLANT
CLOTH BEACON ORANGE TIMEOUT ST (SAFETY) ×2 IMPLANT
FIBER LASER MOSES 200 DFL (Laser) ×2 IMPLANT
GLOVE SURG ENC MOIS LTX SZ7.5 (GLOVE) ×2 IMPLANT
GOWN STRL REUS W/TWL XL LVL3 (GOWN DISPOSABLE) ×2 IMPLANT
GUIDEWIRE ANG ZIPWIRE 038X150 (WIRE) ×2 IMPLANT
GUIDEWIRE STR DUAL SENSOR (WIRE) ×2 IMPLANT
GUIDEWIRE ZIPWRE .038 STRAIGHT (WIRE) ×2 IMPLANT
LASER FIB FLEXIVA PULSE ID 365 (Laser) IMPLANT
MANIFOLD NEPTUNE II (INSTRUMENTS) ×2 IMPLANT
PACK CYSTO (CUSTOM PROCEDURE TRAY) ×2 IMPLANT
SHEATH URETERAL 12FRX28CM (UROLOGICAL SUPPLIES) ×2 IMPLANT
SHEATH URETERAL 12FRX35CM (MISCELLANEOUS) IMPLANT
TRACTIP FLEXIVA PULS ID 200XHI (Laser) IMPLANT
TRACTIP FLEXIVA PULSE ID 200 (Laser)
TUBING CONNECTING 10 (TUBING) ×2 IMPLANT
TUBING UROLOGY SET (TUBING) ×2 IMPLANT

## 2021-06-25 NOTE — Anesthesia Preprocedure Evaluation (Signed)
Anesthesia Evaluation  Patient identified by MRN, date of birth, ID band Patient awake    Reviewed: Allergy & Precautions, H&P , NPO status , Patient's Chart, lab work & pertinent test results  Airway Mallampati: II   Neck ROM: full    Dental   Pulmonary Current Smoker,    breath sounds clear to auscultation       Cardiovascular + dysrhythmias (PSVT)  Rhythm:regular Rate:Normal     Neuro/Psych PSYCHIATRIC DISORDERS Anxiety Depression  Neuromuscular disease (carpal tunnel; ulnar neuropathy)    GI/Hepatic   Endo/Other    Renal/GU stones     Musculoskeletal  (+) Arthritis , Fibromyalgia -  Abdominal   Peds  Hematology   Anesthesia Other Findings   Reproductive/Obstetrics                             Anesthesia Physical  Anesthesia Plan  ASA: 2  Anesthesia Plan: General   Post-op Pain Management:    Induction: Intravenous  PONV Risk Score and Plan: 2 and Ondansetron, Dexamethasone, Midazolam and Treatment may vary due to age or medical condition  Airway Management Planned: LMA  Additional Equipment:   Intra-op Plan:   Post-operative Plan: Extubation in OR  Informed Consent: I have reviewed the patients History and Physical, chart, labs and discussed the procedure including the risks, benefits and alternatives for the proposed anesthesia with the patient or authorized representative who has indicated his/her understanding and acceptance.     Dental advisory given  Plan Discussed with: CRNA, Anesthesiologist and Surgeon  Anesthesia Plan Comments:         Anesthesia Quick Evaluation

## 2021-06-25 NOTE — Op Note (Signed)
Preoperative diagnosis: Right ureteral stone, right renal stones, left renal stones Postoperative diagnosis: Same  Procedure: Cystoscopy with bilateral ureteroscopy laser lithotripsy, ureteral stent exchange  Surgeon: Mena Goes  Anesthesia: General  Indication for procedure: Karla Scott is a 55 year old female with history of stones.  She presented last month in the office with fever and had a right mid ureteral stone 8 mm and some left hydro and large stones in the left kidney.  She underwent urgent bilateral stents.  Culture was negative.  She is remained on nightly antibiotics and brought today for above procedure.  Findings: 8 mm stone was noted in the same location in the right proximal to mid ureter.  It was dusted.  No stones were noted in the collecting system ureteroscopically on the right.  4 stones were noted on the left-the 2 larger ones, smaller left lower pole and medium sized left upper pole stone.  All stones were dusted.  There was a dust cloud on fluoroscopy but no significant fragments noted fluoroscopically or ureteroscopically.  Description of procedure: Consent was obtained patient brought to the operating room.  After adequate anesthesia she was placed in lithotomy position and prepped and draped in the usual sterile fashion.  Timeout was performed to confirm the patient and procedure.  Cystoscope was passed per urethra and the right ureteral stent grasped and removed through the urethral meatus.  A sensor wire was advanced in the collecting system and the stent removed.  A semirigid ureteroscope was advanced up to the right proximal to mid ureteral stone and it was dusted with a 200 m laser fiber.  There were no fragments to remove.  I then passed the scope up toward the right kidney and no other stones were noted in the ureter.  Under direct vision a Glidewire was advanced and coiled in the collecting system and the scope backed out showing a normal ureter apart from some edema and  erythema from the stone impaction site.  I then passed a ureteral access sheath without difficulty over the sensor wire.  Flexible ureteroscope was advanced and the collecting system inspected.  I found no stones in the collecting system visually ureteroscopically.  The access sheath was backed out on the cystoscope and the scope backed out revealing a normal collecting system, renal pelvis and ureter without stone fragment or injury.  The left ureteral stent was protruding through the urethral meatus and it was grasped and a sensor wire advanced into the left kidney.  I then used the access sheath to get 2 wires in place on the left and went over the sensor wire leaving the Glidewire as a safety wire.  The digital ureteroscope was advanced to the left kidney where the stone in the pelvis was noted.  It was flipped up into the upper calyx and dusted.  I then went and dusted one of the large lower pole stones and then the second large lower pole stone and then finally the smaller third lower pole stone.  Both ureteroscopically and fluoroscopically there was excellent fragmentation.  The access sheath was backed out on the ureteroscope and the collecting system renal pelvis ureter inspected on the way out noted to be free of any injury and no fragments in the ureter.  The cystoscope was then repassed in the left wire backloaded on the cystoscope and a 624 cm stent advanced.  The wire was removed with a good coil seen in the kidney and in the bladder.  The right stent was then backloaded  on the cystoscope and a 6 x 24 cm stent advanced.  Wire removed and again a good coil seen in the collecting system and a good coil in the bladder.  The bladder was drained and the scope removed.  She was awakened and taken to the cover him in stable condition.  Complications: None  Blood loss: Minimal  Specimens: None  Drains: Bilateral 6 x 24 cm ureteral stents  Disposition: Patient stable to PACU-I called and discussed the  procedure, postop course and follow-up with Karla Scott.

## 2021-06-25 NOTE — Anesthesia Postprocedure Evaluation (Signed)
Anesthesia Post Note  Patient: Karla Scott  Procedure(s) Performed: CYSTOSCOPY/URETEROSCOPY/HOLMIUM LASER/STENT EXCHANGE (Bilateral: Urethra)     Patient location during evaluation: PACU Anesthesia Type: General Level of consciousness: awake Pain management: pain level controlled Vital Signs Assessment: post-procedure vital signs reviewed and stable Respiratory status: spontaneous breathing and respiratory function stable Cardiovascular status: stable Postop Assessment: no apparent nausea or vomiting Anesthetic complications: no   No notable events documented.  Last Vitals:  Vitals:   06/25/21 1730 06/25/21 1752  BP: 134/65 133/65  Pulse: 96 95  Resp: 18 13  Temp:  37.1 C  SpO2: 92% 99%    Last Pain:  Vitals:   06/25/21 1752  TempSrc: Oral  PainSc: 0-No pain                 Mellody Dance

## 2021-06-25 NOTE — Transfer of Care (Signed)
Immediate Anesthesia Transfer of Care Note  Patient: Karla Scott  Procedure(s) Performed: CYSTOSCOPY/URETEROSCOPY/HOLMIUM LASER/STENT EXCHANGE (Bilateral: Urethra)  Patient Location: PACU  Anesthesia Type:General  Level of Consciousness: drowsy and patient cooperative  Airway & Oxygen Therapy: Patient Spontanous Breathing and Patient connected to face mask oxygen  Post-op Assessment: Report given to RN and Post -op Vital signs reviewed and stable  Post vital signs: Reviewed and stable  Last Vitals:  Vitals Value Taken Time  BP 128/74 06/25/21 1700  Temp    Pulse 103 06/25/21 1703  Resp 18 06/25/21 1703  SpO2 99 % 06/25/21 1703  Vitals shown include unvalidated device data.  Last Pain:  Vitals:   06/25/21 1221  TempSrc:   PainSc: 1       Patients Stated Pain Goal: 5 (06/25/21 1221)  Complications: No notable events documented.

## 2021-06-25 NOTE — H&P (Signed)
H&P  Chief Complaint:   History of Present Illness: Karla Scott is a 55 y.o. patient with history of kidney stones who presented to the office 05/24/2021 with fever and bilateral flank pain.  A CT scan demonstrated a large obstructing stone in the right kidney and bilateral nonobstructing stones with some left-sided hydronephrosis.  She was taken urgently for cystoscopy with bilateral ureteral stents.  Her urine culture turned up negative and she stayed on a nightly nitrofurantoin.  She has been well with some stent pain but no significant dysuria.  No fever.  She presents today for bilateral ureteroscopy.  Past Medical History:  Diagnosis Date   Acute upper respiratory infection    Backache    Cervicalgia    Chronic pain in left foot    Costalchondritis    CTS (carpal tunnel syndrome)    Depressive disorder    Fatigue    Fibromyalgia    History of kidney stones    Hypercholesteremia    Insomnia    Joint pain    Lateral epicondylitis of right elbow    Nausea    Palpitations    Paroxysmal supraventricular tachycardia (HCC)    last time  several months ago   Pleurisy yrs ago   Sinusitis    Tobacco abuse    Trigeminal neuralgia    Ulnar neuropathy at elbow    UTI (urinary tract infection)    Viral labyrinthitis    Past Surgical History:  Procedure Laterality Date   APPENDECTOMY     CYSTOSCOPY W/ URETERAL STENT PLACEMENT Bilateral 05/24/2021   Procedure: CYSTOSCOPY WITH RETROGRADE PYELOGRAM/URETERAL STENT PLACEMENT;  Surgeon: Crist Fat, MD;  Location: [facility];  Service: Urology;  Laterality: Bilateral;   CYSTOSCOPY WITH STENT PLACEMENT Right 05/29/2016   Procedure: CYSTOSCOPY, RETROGRADE, STENT PLACEMENT;  Surgeon: Jerilee Field, MD;  Location: [facility];  Service: Urology;  Laterality: Right;   CYSTOSCOPY WITH STENT PLACEMENT Left 01/06/2018   Procedure: CYSTOSCOPY WITH STENT PLACEMENT LEFT;  Surgeon: Heloise Purpura, MD;  Location: [facility];  Service: Urology;  Laterality:  Left;   CYSTOSCOPY WITH STENT PLACEMENT Bilateral 03/23/2020   Procedure: CYSTOSCOPY WITH  BILATERAL STENT PLACEMENT;  Surgeon: Rene Paci, MD;  Location: [facility];  Service: Urology;  Laterality: Bilateral;   CYSTOSCOPY/URETEROSCOPY/HOLMIUM LASER/STENT PLACEMENT Bilateral 04/03/2020   Procedure: CYSTOSCOPY/URETEROSCOPY/HOLMIUM LASER/STENT PLACEMENT;  Surgeon: Jerilee Field, MD;  Location: [facility];  Service: Urology;  Laterality: Bilateral;   HOLMIUM LASER APPLICATION Bilateral 04/03/2020   Procedure: HOLMIUM LASER APPLICATION;  Surgeon: Jerilee Field, MD;  Location: [facility];  Service: Urology;  Laterality: Bilateral;   LITHOTRIPSY  2017   URETEROSCOPY WITH HOLMIUM LASER LITHOTRIPSY Right 06/10/2016   Procedure: RIGHT URETEROSCOPY WITH HOLMIUM LASER LITHOTRIPSY AND STENT;  Surgeon: Jerilee Field, MD;  Location: [facility];  Service: Urology;  Laterality: Right;   VAGINAL HYSTERECTOMY  yrs ago   partial   WISDOM TOOTH EXTRACTION      Home Medications:  Medications Prior to Admission  Medication Sig Dispense Refill Last Dose   ALPRAZolam (XANAX) 0.25 MG tablet Take 0.25 mg by mouth 2 (two) times daily as needed for anxiety.   06/24/2021   DULoxetine (CYMBALTA) 60 MG capsule Take 60 mg by mouth daily.   06/24/2021   HYDROcodone-acetaminophen (NORCO/VICODIN) 5-325 MG tablet Take 1 tablet by mouth every 6 (six) hours as needed for pain.   Past Week   Meth-Hyo-M Bl-Na Phos-Ph Sal (URO-MP) 118 MG CAPS Take 1 capsule  by mouth 3 (three) times daily as needed for pain.   Past Week   nitrofurantoin, macrocrystal-monohydrate, (MACROBID) 100 MG capsule Take 100 mg by mouth at bedtime.   06/24/2021   ondansetron (ZOFRAN) 4 MG tablet Take 4 mg by mouth every 8 (eight) hours as needed for nausea/vomiting.   Past Week   rosuvastatin (CRESTOR) 10 MG tablet Take 10 mg by mouth at bedtime.   06/24/2021   traZODone (DESYREL) 150 MG  tablet Take 150 mg by mouth at bedtime.    06/24/2021   Allergies:  Allergies  Allergen Reactions   Ambien [Zolpidem Tartrate] Other (See Comments)    Bad dreams   Lipitor [Atorvastatin] Other (See Comments)    Joint soreness   Sulfa Antibiotics Hives    Family History  Problem Relation Age of Onset   Kidney disease Mother    CVA Father    Kidney Stones Sister    Heart attack Sister    Healthy Son    Social History:  reports that she has been smoking cigarettes. She has a 15.00 pack-year smoking history. She has never used smokeless tobacco. She reports current alcohol use. She reports that she does not use drugs.  ROS: A complete review of systems was performed.  All systems are negative except for pertinent findings as noted. ROS   Physical Exam:  Vital signs in last 24 hours: Temp:  [98.2 F (36.8 C)] 98.2 F (36.8 C) (10/25 1214) Pulse Rate:  [108] 108 (10/25 1214) Resp:  [15] 15 (10/25 1214) BP: (113)/(57) 113/57 (10/25 1214) SpO2:  [98 %] 98 % (10/25 1214) Weight:  [53.3 kg] 53.3 kg (10/25 1208) General:  Alert and oriented, No acute distress HEENT: Normocephalic, atraumatic Cardiovascular: Regular rate and rhythm Lungs: Regular rate and effort Abdomen: Soft, nontender, nondistended, no abdominal masses Back: No CVA tenderness Extremities: No edema Neurologic: Grossly intact  Laboratory Data:  No results found for this or any previous visit (from the past 24 hour(s)). No results found for this or any previous visit (from the past 240 hour(s)). Creatinine: No results for input(s): CREATININE in the last 168 hours.  Impression/Assessment/plan:  I discussed with the patient the nature, potential benefits, risks and alternatives to cystoscopy with bilateral ureteroscopy laser lithotripsy, ureteral stent exchange, including side effects of the proposed treatment, the likelihood of the patient achieving the goals of the procedure, and any potential problems that  might occur during the procedure or recuperation.  We discussed given the significant left stone burden she may need a staged procedure on the side with repeat stenting ureteroscopy or shockwave.  All questions answered. Patient elects to proceed.    Jerilee Field 06/25/2021, 1:59 PM

## 2021-06-25 NOTE — Anesthesia Procedure Notes (Signed)
Procedure Name: LMA Insertion Date/Time: 06/25/2021 2:45 PM Performed by: Elisabeth Cara, CRNA Pre-anesthesia Checklist: Patient identified, Emergency Drugs available, Suction available, Patient being monitored and Timeout performed Patient Re-evaluated:Patient Re-evaluated prior to induction Oxygen Delivery Method: Circle system utilized Preoxygenation: Pre-oxygenation with 100% oxygen Induction Type: IV induction LMA: LMA with gastric port inserted LMA Size: 3.0 Number of attempts: 1 Tube secured with: Tape Dental Injury: Teeth and Oropharynx as per pre-operative assessment

## 2021-06-25 NOTE — H&P (View-Only) (Signed)
H&P  Chief Complaint:   History of Present Illness: Karla Scott is a 55 y.o. patient with history of kidney stones who presented to the office 05/24/2021 with fever and bilateral flank pain.  A CT scan demonstrated a large obstructing stone in the right kidney and bilateral nonobstructing stones with some left-sided hydronephrosis.  She was taken urgently for cystoscopy with bilateral ureteral stents.  Her urine culture turned up negative and she stayed on a nightly nitrofurantoin.  She has been well with some stent pain but no significant dysuria.  No fever.  She presents today for bilateral ureteroscopy.  Past Medical History:  Diagnosis Date   Acute upper respiratory infection    Backache    Cervicalgia    Chronic pain in left foot    Costalchondritis    CTS (carpal tunnel syndrome)    Depressive disorder    Fatigue    Fibromyalgia    History of kidney stones    Hypercholesteremia    Insomnia    Joint pain    Lateral epicondylitis of right elbow    Nausea    Palpitations    Paroxysmal supraventricular tachycardia (HCC)    last time  several months ago   Pleurisy yrs ago   Sinusitis    Tobacco abuse    Trigeminal neuralgia    Ulnar neuropathy at elbow    UTI (urinary tract infection)    Viral labyrinthitis    Past Surgical History:  Procedure Laterality Date   APPENDECTOMY     CYSTOSCOPY W/ URETERAL STENT PLACEMENT Bilateral 05/24/2021   Procedure: CYSTOSCOPY WITH RETROGRADE PYELOGRAM/URETERAL STENT PLACEMENT;  Surgeon: Crist Fat, MD;  Location: [facility];  Service: Urology;  Laterality: Bilateral;   CYSTOSCOPY WITH STENT PLACEMENT Right 05/29/2016   Procedure: CYSTOSCOPY, RETROGRADE, STENT PLACEMENT;  Surgeon: Jerilee Field, MD;  Location: [facility];  Service: Urology;  Laterality: Right;   CYSTOSCOPY WITH STENT PLACEMENT Left 01/06/2018   Procedure: CYSTOSCOPY WITH STENT PLACEMENT LEFT;  Surgeon: Heloise Purpura, MD;  Location: [facility];  Service: Urology;  Laterality:  Left;   CYSTOSCOPY WITH STENT PLACEMENT Bilateral 03/23/2020   Procedure: CYSTOSCOPY WITH  BILATERAL STENT PLACEMENT;  Surgeon: Rene Paci, MD;  Location: [facility];  Service: Urology;  Laterality: Bilateral;   CYSTOSCOPY/URETEROSCOPY/HOLMIUM LASER/STENT PLACEMENT Bilateral 04/03/2020   Procedure: CYSTOSCOPY/URETEROSCOPY/HOLMIUM LASER/STENT PLACEMENT;  Surgeon: Jerilee Field, MD;  Location: [facility];  Service: Urology;  Laterality: Bilateral;   HOLMIUM LASER APPLICATION Bilateral 04/03/2020   Procedure: HOLMIUM LASER APPLICATION;  Surgeon: Jerilee Field, MD;  Location: [facility];  Service: Urology;  Laterality: Bilateral;   LITHOTRIPSY  2017   URETEROSCOPY WITH HOLMIUM LASER LITHOTRIPSY Right 06/10/2016   Procedure: RIGHT URETEROSCOPY WITH HOLMIUM LASER LITHOTRIPSY AND STENT;  Surgeon: Jerilee Field, MD;  Location: [facility];  Service: Urology;  Laterality: Right;   VAGINAL HYSTERECTOMY  yrs ago   partial   WISDOM TOOTH EXTRACTION      Home Medications:  Medications Prior to Admission  Medication Sig Dispense Refill Last Dose   ALPRAZolam (XANAX) 0.25 MG tablet Take 0.25 mg by mouth 2 (two) times daily as needed for anxiety.   06/24/2021   DULoxetine (CYMBALTA) 60 MG capsule Take 60 mg by mouth daily.   06/24/2021   HYDROcodone-acetaminophen (NORCO/VICODIN) 5-325 MG tablet Take 1 tablet by mouth every 6 (six) hours as needed for pain.   Past Week   Meth-Hyo-M Bl-Na Phos-Ph Sal (URO-MP) 118 MG CAPS Take 1 capsule  by mouth 3 (three) times daily as needed for pain.   Past Week   nitrofurantoin, macrocrystal-monohydrate, (MACROBID) 100 MG capsule Take 100 mg by mouth at bedtime.   06/24/2021   ondansetron (ZOFRAN) 4 MG tablet Take 4 mg by mouth every 8 (eight) hours as needed for nausea/vomiting.   Past Week   rosuvastatin (CRESTOR) 10 MG tablet Take 10 mg by mouth at bedtime.   06/24/2021   traZODone (DESYREL) 150 MG  tablet Take 150 mg by mouth at bedtime.    06/24/2021   Allergies:  Allergies  Allergen Reactions   Ambien [Zolpidem Tartrate] Other (See Comments)    Bad dreams   Lipitor [Atorvastatin] Other (See Comments)    Joint soreness   Sulfa Antibiotics Hives    Family History  Problem Relation Age of Onset   Kidney disease Mother    CVA Father    Kidney Stones Sister    Heart attack Sister    Healthy Son    Social History:  reports that she has been smoking cigarettes. She has a 15.00 pack-year smoking history. She has never used smokeless tobacco. She reports current alcohol use. She reports that she does not use drugs.  ROS: A complete review of systems was performed.  All systems are negative except for pertinent findings as noted. ROS   Physical Exam:  Vital signs in last 24 hours: Temp:  [98.2 F (36.8 C)] 98.2 F (36.8 C) (10/25 1214) Pulse Rate:  [108] 108 (10/25 1214) Resp:  [15] 15 (10/25 1214) BP: (113)/(57) 113/57 (10/25 1214) SpO2:  [98 %] 98 % (10/25 1214) Weight:  [53.3 kg] 53.3 kg (10/25 1208) General:  Alert and oriented, No acute distress HEENT: Normocephalic, atraumatic Cardiovascular: Regular rate and rhythm Lungs: Regular rate and effort Abdomen: Soft, nontender, nondistended, no abdominal masses Back: No CVA tenderness Extremities: No edema Neurologic: Grossly intact  Laboratory Data:  No results found for this or any previous visit (from the past 24 hour(s)). No results found for this or any previous visit (from the past 240 hour(s)). Creatinine: No results for input(s): CREATININE in the last 168 hours.  Impression/Assessment/plan:  I discussed with the patient the nature, potential benefits, risks and alternatives to cystoscopy with bilateral ureteroscopy laser lithotripsy, ureteral stent exchange, including side effects of the proposed treatment, the likelihood of the patient achieving the goals of the procedure, and any potential problems that  might occur during the procedure or recuperation.  We discussed given the significant left stone burden she may need a staged procedure on the side with repeat stenting ureteroscopy or shockwave.  All questions answered. Patient elects to proceed.    Jerilee Field 06/25/2021, 1:59 PM

## 2021-06-26 ENCOUNTER — Encounter (HOSPITAL_COMMUNITY): Payer: Self-pay | Admitting: Urology

## 2021-06-27 ENCOUNTER — Emergency Department (HOSPITAL_COMMUNITY)
Admission: EM | Admit: 2021-06-27 | Discharge: 2021-06-27 | Discharge status: Against Medical Advice | Payer: BC Managed Care – PPO | Attending: Physician Assistant | Admitting: Physician Assistant

## 2021-06-27 ENCOUNTER — Emergency Department (HOSPITAL_COMMUNITY): Payer: BC Managed Care – PPO

## 2021-06-27 ENCOUNTER — Encounter (HOSPITAL_COMMUNITY): Payer: Self-pay | Admitting: Emergency Medicine

## 2021-06-27 DIAGNOSIS — Z5321 Procedure and treatment not carried out due to patient leaving prior to being seen by health care provider: Secondary | ICD-10-CM | POA: Insufficient documentation

## 2021-06-27 DIAGNOSIS — R509 Fever, unspecified: Secondary | ICD-10-CM | POA: Insufficient documentation

## 2021-06-27 LAB — CBC WITH DIFFERENTIAL/PLATELET
Abs Immature Granulocytes: 0.1 10*3/uL — ABNORMAL HIGH (ref 0.00–0.07)
Basophils Absolute: 0.1 10*3/uL (ref 0.0–0.1)
Basophils Relative: 0 %
Eosinophils Absolute: 0.1 10*3/uL (ref 0.0–0.5)
Eosinophils Relative: 0 %
HCT: 44.1 % (ref 36.0–46.0)
Hemoglobin: 14.7 g/dL (ref 12.0–15.0)
Immature Granulocytes: 1 %
Lymphocytes Relative: 10 %
Lymphs Abs: 1.7 10*3/uL (ref 0.7–4.0)
MCH: 32.7 pg (ref 26.0–34.0)
MCHC: 33.3 g/dL (ref 30.0–36.0)
MCV: 98.2 fL (ref 80.0–100.0)
Monocytes Absolute: 1.3 10*3/uL — ABNORMAL HIGH (ref 0.1–1.0)
Monocytes Relative: 8 %
Neutro Abs: 13.3 10*3/uL — ABNORMAL HIGH (ref 1.7–7.7)
Neutrophils Relative %: 81 %
Platelets: 274 10*3/uL (ref 150–400)
RBC: 4.49 MIL/uL (ref 3.87–5.11)
RDW: 12.6 % (ref 11.5–15.5)
WBC: 16.5 10*3/uL — ABNORMAL HIGH (ref 4.0–10.5)
nRBC: 0 % (ref 0.0–0.2)

## 2021-06-27 LAB — COMPREHENSIVE METABOLIC PANEL
ALT: 9 U/L (ref 0–44)
AST: 17 U/L (ref 15–41)
Albumin: 3.9 g/dL (ref 3.5–5.0)
Alkaline Phosphatase: 66 U/L (ref 38–126)
Anion gap: 8 (ref 5–15)
BUN: 12 mg/dL (ref 6–20)
CO2: 24 mmol/L (ref 22–32)
Calcium: 8.6 mg/dL — ABNORMAL LOW (ref 8.9–10.3)
Chloride: 102 mmol/L (ref 98–111)
Creatinine, Ser: 1.16 mg/dL — ABNORMAL HIGH (ref 0.44–1.00)
GFR, Estimated: 56 mL/min — ABNORMAL LOW (ref >60)
Glucose, Bld: 104 mg/dL — ABNORMAL HIGH (ref 70–99)
Potassium: 3.4 mmol/L — ABNORMAL LOW (ref 3.5–5.1)
Sodium: 134 mmol/L — ABNORMAL LOW (ref 135–145)
Total Bilirubin: 0.8 mg/dL (ref 0.3–1.2)
Total Protein: 7.3 g/dL (ref 6.5–8.1)

## 2021-06-27 LAB — LACTIC ACID, PLASMA: Lactic Acid, Venous: 1.1 mmol/L (ref 0.5–1.9)

## 2021-06-27 NOTE — ED Provider Notes (Signed)
Emergency Medicine Provider Triage Evaluation Note  Karla Scott , a 55 y.o. female  was evaluated in triage.  Pt complains of fever that started last night. Recently admitted for bilat ureteral stones and had stents placed. Urology advised her to come here.  Review of Systems  Positive: fever Negative: Nvd, urinary sxs, abd pain, flank pain, cough, sob   Physical Exam  BP 105/66 (BP Location: Left Arm)   Pulse (!) 110   Temp 98.4 F (36.9 C) (Oral)   Resp 17   SpO2 94%  Gen:   Awake, no distress   Resp:  Normal effort  MSK:   Moves extremities without difficulty   Medical Decision Making  Medically screening exam initiated at 2:42 PM.  Appropriate orders placed.  Karla Scott was informed that the remainder of the evaluation will be completed by another provider, this initial triage assessment does not replace that evaluation, and the importance of remaining in the ED until their evaluation is complete.    Karla Scott 06/27/21 1444    Karla Sprout, MD 06/27/21 [phone]

## 2021-06-27 NOTE — ED Triage Notes (Signed)
Patient here from home reporting fever. Recent surgery for stent placement and lithotripsy. Reports ibuprofen with some relief. States that symptoms started yesterday. No pain today.

## 2021-07-02 LAB — CULTURE, BLOOD (SINGLE)
Culture: NO GROWTH
Special Requests: ADEQUATE

## 2021-07-18 ENCOUNTER — Encounter (HOSPITAL_BASED_OUTPATIENT_CLINIC_OR_DEPARTMENT_OTHER): Payer: Self-pay | Admitting: Urology

## 2021-07-18 ENCOUNTER — Other Ambulatory Visit: Payer: Self-pay | Admitting: Urology

## 2021-07-19 ENCOUNTER — Encounter (HOSPITAL_BASED_OUTPATIENT_CLINIC_OR_DEPARTMENT_OTHER): Payer: Self-pay | Admitting: Urology

## 2021-07-19 ENCOUNTER — Other Ambulatory Visit: Payer: Self-pay

## 2021-07-19 NOTE — Progress Notes (Signed)
Spoke w/ via phone for pre-op interview--- pt Lab needs dos----  no             Lab results------ no COVID test -----patient states asymptomatic no test needed Arrive at ------- 0730 on 07-23-2021 NPO after MN NO Solid Food.  Clear liquids from MN until--- 0630 Med rec completed Medications to take morning of surgery ----- none Diabetic medication ----- n/a Patient instructed no nail polish to be worn day of surgery Patient instructed to bring photo id and insurance card day of surgery Patient aware to have Driver (ride ) / caregiver for 24 hours after surgery ---sister, Karla Scott Patient Special Instructions -----  n/a Pre-Op special Istructions ----- n/a Patient verbalized understanding of instructions that were given at this phone interview. Patient denies shortness of breath, chest pain, fever, cough at this phone interview.

## 2021-07-23 ENCOUNTER — Ambulatory Visit (HOSPITAL_BASED_OUTPATIENT_CLINIC_OR_DEPARTMENT_OTHER)
Admission: RE | Admit: 2021-07-23 | Discharge: 2021-07-23 | Disposition: A | Discharge status: Home / Self Care | Payer: BC Managed Care – PPO | Attending: Urology | Admitting: Urology

## 2021-07-23 ENCOUNTER — Ambulatory Visit (HOSPITAL_BASED_OUTPATIENT_CLINIC_OR_DEPARTMENT_OTHER): Payer: BC Managed Care – PPO | Admitting: Anesthesiology

## 2021-07-23 ENCOUNTER — Ambulatory Visit (HOSPITAL_COMMUNITY): Payer: BC Managed Care – PPO

## 2021-07-23 ENCOUNTER — Encounter (HOSPITAL_BASED_OUTPATIENT_CLINIC_OR_DEPARTMENT_OTHER): Payer: Self-pay | Admitting: Urology

## 2021-07-23 ENCOUNTER — Other Ambulatory Visit: Payer: Self-pay

## 2021-07-23 ENCOUNTER — Encounter (HOSPITAL_BASED_OUTPATIENT_CLINIC_OR_DEPARTMENT_OTHER)
Admission: RE | Disposition: A | Discharge status: Home / Self Care | Payer: Self-pay | Source: Home / Self Care | Attending: Urology

## 2021-07-23 DIAGNOSIS — F17200 Nicotine dependence, unspecified, uncomplicated: Secondary | ICD-10-CM | POA: Diagnosis not present

## 2021-07-23 DIAGNOSIS — M797 Fibromyalgia: Secondary | ICD-10-CM | POA: Insufficient documentation

## 2021-07-23 DIAGNOSIS — N200 Calculus of kidney: Secondary | ICD-10-CM | POA: Insufficient documentation

## 2021-07-23 HISTORY — PX: CYSTOSCOPY W/ URETERAL STENT PLACEMENT: SHX1429

## 2021-07-23 HISTORY — DX: Unspecified osteoarthritis, unspecified site: M19.90

## 2021-07-23 HISTORY — DX: Calculus of kidney: N20.0

## 2021-07-23 HISTORY — DX: Presence of spectacles and contact lenses: Z97.3

## 2021-07-23 HISTORY — DX: Personal history of other diseases of the nervous system and sense organs: Z86.69

## 2021-07-23 SURGERY — CYSTOSCOPY, WITH RETROGRADE PYELOGRAM AND URETERAL STENT INSERTION
Anesthesia: General | Site: Ureter | Laterality: Bilateral

## 2021-07-23 MED ORDER — LACTATED RINGERS IV SOLN: INTRAVENOUS | Status: DC

## 2021-07-23 MED ORDER — PROMETHAZINE HCL 25 MG/ML IJ SOLN: 6.2500 mg | INTRAMUSCULAR | Status: DC | PRN

## 2021-07-23 MED ORDER — DEXAMETHASONE SODIUM PHOSPHATE 10 MG/ML IJ SOLN
INTRAMUSCULAR | Status: AC
  Filled 2021-07-23: qty 1

## 2021-07-23 MED ORDER — PHENYLEPHRINE 40 MCG/ML (10ML) SYRINGE FOR IV PUSH (FOR BLOOD PRESSURE SUPPORT)
PREFILLED_SYRINGE | INTRAVENOUS | Status: DC | PRN
  Administered 2021-07-23 (×4): 80 ug via INTRAVENOUS

## 2021-07-23 MED ORDER — FLUCONAZOLE IN SODIUM CHLORIDE 400-0.9 MG/200ML-% IV SOLN
400.0000 mg | INTRAVENOUS | Status: DC
  Administered 2021-07-23: 400 mg via INTRAVENOUS
  Filled 2021-07-23: qty 200

## 2021-07-23 MED ORDER — FENTANYL CITRATE (PF) 100 MCG/2ML IJ SOLN: 25.0000 ug | INTRAMUSCULAR | Status: DC | PRN

## 2021-07-23 MED ORDER — PROPOFOL 10 MG/ML IV BOLUS
INTRAVENOUS | Status: DC | PRN
  Administered 2021-07-23: 130 mg via INTRAVENOUS
  Administered 2021-07-23: 40 mg via INTRAVENOUS

## 2021-07-23 MED ORDER — FENTANYL CITRATE (PF) 100 MCG/2ML IJ SOLN
INTRAMUSCULAR | Status: AC
  Filled 2021-07-23: qty 2

## 2021-07-23 MED ORDER — MEPERIDINE HCL 25 MG/ML IJ SOLN: 6.2500 mg | INTRAMUSCULAR | Status: DC | PRN

## 2021-07-23 MED ORDER — PHENYLEPHRINE 40 MCG/ML (10ML) SYRINGE FOR IV PUSH (FOR BLOOD PRESSURE SUPPORT)
PREFILLED_SYRINGE | INTRAVENOUS | Status: AC
  Filled 2021-07-23: qty 10

## 2021-07-23 MED ORDER — LIDOCAINE 2% (20 MG/ML) 5 ML SYRINGE
INTRAMUSCULAR | Status: AC
  Filled 2021-07-23: qty 5

## 2021-07-23 MED ORDER — DEXAMETHASONE SODIUM PHOSPHATE 10 MG/ML IJ SOLN
INTRAMUSCULAR | Status: DC | PRN
  Administered 2021-07-23: 10 mg via INTRAVENOUS

## 2021-07-23 MED ORDER — CEFAZOLIN SODIUM-DEXTROSE 2-4 GM/100ML-% IV SOLN
INTRAVENOUS | Status: AC
  Filled 2021-07-23: qty 100

## 2021-07-23 MED ORDER — MIDAZOLAM HCL 5 MG/5ML IJ SOLN
INTRAMUSCULAR | Status: DC | PRN
  Administered 2021-07-23: 2 mg via INTRAVENOUS

## 2021-07-23 MED ORDER — IOHEXOL 300 MG/ML  SOLN
INTRAMUSCULAR | Status: DC | PRN
  Administered 2021-07-23: 5 mL

## 2021-07-23 MED ORDER — CEFAZOLIN SODIUM-DEXTROSE 2-4 GM/100ML-% IV SOLN
2.0000 g | Freq: Once | INTRAVENOUS | Status: AC
  Administered 2021-07-23: 2 g via INTRAVENOUS

## 2021-07-23 MED ORDER — LIDOCAINE 2% (20 MG/ML) 5 ML SYRINGE
INTRAMUSCULAR | Status: DC | PRN
  Administered 2021-07-23: 60 mg via INTRAVENOUS

## 2021-07-23 MED ORDER — MIDAZOLAM HCL 2 MG/2ML IJ SOLN
INTRAMUSCULAR | Status: AC
  Filled 2021-07-23: qty 2

## 2021-07-23 MED ORDER — ONDANSETRON HCL 4 MG/2ML IJ SOLN
INTRAMUSCULAR | Status: AC
  Filled 2021-07-23: qty 2

## 2021-07-23 MED ORDER — FENTANYL CITRATE (PF) 100 MCG/2ML IJ SOLN
INTRAMUSCULAR | Status: DC | PRN
  Administered 2021-07-23 (×4): 50 ug via INTRAVENOUS

## 2021-07-23 MED ORDER — ONDANSETRON HCL 4 MG/2ML IJ SOLN
INTRAMUSCULAR | Status: DC | PRN
  Administered 2021-07-23: 4 mg via INTRAVENOUS

## 2021-07-23 MED ORDER — SODIUM CHLORIDE 0.9 % IR SOLN
Status: DC | PRN
  Administered 2021-07-23: 3000 mL via INTRAVESICAL

## 2021-07-23 MED ORDER — PROPOFOL 10 MG/ML IV BOLUS
INTRAVENOUS | Status: AC
  Filled 2021-07-23: qty 20

## 2021-07-23 SURGICAL SUPPLY — 27 items
BAG DRAIN URO-CYSTO SKYTR STRL (DRAIN) ×2 IMPLANT
BAG DRN UROCATH (DRAIN) ×1
BASKET ZERO TIP NITINOL 2.4FR (BASKET) ×2 IMPLANT
BSKT STON RTRVL ZERO TP 2.4FR (BASKET) ×1
CATH URET 5FR 28IN CONE TIP (BALLOONS)
CATH URET 5FR 28IN OPEN ENDED (CATHETERS) IMPLANT
CATH URET 5FR 70CM CONE TIP (BALLOONS) IMPLANT
CATH URET DUAL LUMEN 6-10FR 50 (CATHETERS) IMPLANT
CLOTH BEACON ORANGE TIMEOUT ST (SAFETY) ×2 IMPLANT
DRSG TEGADERM 2-3/8X2-3/4 SM (GAUZE/BANDAGES/DRESSINGS) ×2 IMPLANT
FIBER LASER TRAC TIP (UROLOGICAL SUPPLIES) ×2 IMPLANT
GLOVE SURG ENC MOIS LTX SZ7.5 (GLOVE) ×2 IMPLANT
GLOVE SURG ENC MOIS LTX SZ8 (GLOVE) IMPLANT
GOWN STRL REUS W/TWL LRG LVL3 (GOWN DISPOSABLE) ×2 IMPLANT
GUIDEWIRE ANG ZIPWIRE 038X150 (WIRE) IMPLANT
GUIDEWIRE STR DUAL SENSOR (WIRE) ×4 IMPLANT
GUIDEWIRE ZIPWRE .038 STRAIGHT (WIRE) ×2 IMPLANT
IV NS IRRIG 3000ML ARTHROMATIC (IV SOLUTION) ×4 IMPLANT
KIT TURNOVER CYSTO (KITS) ×2 IMPLANT
MANIFOLD NEPTUNE II (INSTRUMENTS) ×2 IMPLANT
NS IRRIG 500ML POUR BTL (IV SOLUTION) ×2 IMPLANT
PACK CYSTO (CUSTOM PROCEDURE TRAY) ×2 IMPLANT
SHEATH URETERAL 12FRX28CM (UROLOGICAL SUPPLIES) ×2 IMPLANT
STENT URET 6FRX24 CONTOUR (STENTS) ×4 IMPLANT
SYR CONTROL 10ML LL (SYRINGE) ×2 IMPLANT
TUBE CONNECTING 12X1/4 (SUCTIONS) ×2 IMPLANT
TUBING UROLOGY SET (TUBING) ×2 IMPLANT

## 2021-07-23 NOTE — Discharge Instructions (Addendum)
[facility] [phone] Post Ureteroscopy With or Without Stent Instructions  Removal of the stents-remove the stent on Monday morning, 07/29/2021 by pulling the strings as instructed  Definitions: Ureter: The duct that transports urine from the kidney to the bladder. Stent:   A plastic hollow tube that is placed into the ureter, from the kidney to the bladder to prevent the ureter from swelling shut.  GENERAL INSTRUCTIONS:  Despite the fact that no skin incisions were used, the area around the ureter and bladder is raw and irritated. The stent is a foreign body which will further irritate the bladder wall. This irritation is manifested by increased frequency of urination, both day and night, and by an increase in the urge to urinate. In some, the urge to urinate is present almost always. Sometimes the urge is strong enough that you may not be able to stop yourself from urinating. The only real cure is to remove the stent and then give time for the bladder wall to heal which can't be done until the danger of the ureter swelling shut has passed, which varies.  You may see some blood in your urine while the stent is in place and a few days afterwards. Do not be alarmed, even if the urine was clear for a while. Get off your feet and drink lots of fluids until clearing occurs. If you start to pass clots or don't improve, call us.  DIET: You may return to your normal diet immediately. Because of the raw surface of your bladder, alcohol, spicy foods, acid type foods and drinks with caffeine may cause irritation or frequency and should be used in moderation. To keep your urine flowing freely and to avoid constipation, drink plenty of fluids during the day ( 8-10 glasses ). Tip: Avoid cranberry juice because it is very acidic.  ACTIVITY: Your physical activity doesn't need to be restricted. However, if you are very active, you may see some blood in your urine. We suggest that you  reduce your activity under these circumstances until the bleeding has stopped.  BOWELS: It is important to keep your bowels regular during the postoperative period. Straining with bowel movements can cause bleeding. A bowel movement every other day is reasonable. Use a mild laxative if needed, such as Milk of Magnesia 2-3 tablespoons, or 2 Dulcolax tablets. Call if you continue to have problems. If you have been taking narcotics for pain, before, during or after your surgery, you may be constipated. Take a laxative if necessary.   MEDICATION: You should resume your pre-surgery medications unless told not to. In addition you will often be given an antibiotic to prevent infection. These should be taken as prescribed until the bottles are finished unless you are having an unusual reaction to one of the drugs.  PROBLEMS YOU SHOULD REPORT TO [place]: Fevers over 100.5 Fahrenheit. Heavy bleeding, or clots ( See above notes about blood in urine ). Inability to urinate. Drug reactions ( hives, rash, nausea, vomiting, diarrhea ). Severe burning or pain with urination that is not improving.  FOLLOW-UP: You will need a follow-up appointment to monitor your progress. Call for this appointment at the number listed above. Usually the first appointment will be about three to fourteen days after your surgery.         Post Anesthesia Home Care Instructions  Activity: Get plenty of rest for the remainder of the day. A responsible individual must stay with you for 24 hours following the procedure.  For the  next 24 hours, DO NOT: -Drive a car -Advertising copywriter -Drink alcoholic beverages -Take any medication unless instructed by your physician -Make any legal decisions or sign important papers.  Meals: Start with liquid foods such as gelatin or soup. Progress to regular foods as tolerated. Avoid greasy, spicy, heavy foods. If nausea and/or vomiting occur, drink only clear liquids until the nausea and/or  vomiting subsides. Call your physician if vomiting continues.  Special Instructions/Symptoms: Your throat may feel dry or sore from the anesthesia or the breathing tube placed in your throat during surgery. If this causes discomfort, gargle with warm salt water. The discomfort should disappear within 24 hours.

## 2021-07-23 NOTE — Anesthesia Procedure Notes (Signed)
Procedure Name: LMA Insertion Date/Time: 07/23/2021 9:41 AM Performed by: Bishop Limbo, CRNA Pre-anesthesia Checklist: Patient identified, Emergency Drugs available, Suction available and Patient being monitored Patient Re-evaluated:Patient Re-evaluated prior to induction Oxygen Delivery Method: Circle System Utilized Preoxygenation: Pre-oxygenation with 100% oxygen Induction Type: IV induction Ventilation: Mask ventilation without difficulty LMA: LMA inserted LMA Size: 3.0 Number of attempts: 1 Placement Confirmation: positive ETCO2 Tube secured with: Tape Dental Injury: Teeth and Oropharynx as per pre-operative assessment

## 2021-07-23 NOTE — Anesthesia Preprocedure Evaluation (Addendum)
Anesthesia Evaluation  Patient identified by MRN, date of birth, ID band Patient awake    Reviewed: Allergy & Precautions, H&P , NPO status , Patient's Chart, lab work & pertinent test results  Airway Mallampati: II  TM Distance: >3 FB Neck ROM: Full    Dental no notable dental hx. (+) Dental Advisory Given, Teeth Intact   Pulmonary Current Smoker and Patient abstained from smoking.,    Pulmonary exam normal breath sounds clear to auscultation       Cardiovascular Normal cardiovascular exam+ dysrhythmias (PSVT)  Rhythm:Regular Rate:Normal     Neuro/Psych PSYCHIATRIC DISORDERS Anxiety Depression  Neuromuscular disease (carpal tunnel; ulnar neuropathy)    GI/Hepatic   Endo/Other    Renal/GU Renal diseasestones     Musculoskeletal  (+) Arthritis , Fibromyalgia -  Abdominal   Peds  Hematology   Anesthesia Other Findings   Reproductive/Obstetrics                            Anesthesia Physical  Anesthesia Plan  ASA: 2  Anesthesia Plan: General   Post-op Pain Management: Minimal or no pain anticipated   Induction: Intravenous  PONV Risk Score and Plan: 3 and Ondansetron, Dexamethasone, Midazolam and Treatment may vary due to age or medical condition  Airway Management Planned: LMA  Additional Equipment:   Intra-op Plan:   Post-operative Plan: Extubation in OR  Informed Consent: I have reviewed the patients History and Physical, chart, labs and discussed the procedure including the risks, benefits and alternatives for the proposed anesthesia with the patient or authorized representative who has indicated his/her understanding and acceptance.     Dental advisory given  Plan Discussed with: CRNA  Anesthesia Plan Comments:        Anesthesia Quick Evaluation

## 2021-07-23 NOTE — Op Note (Signed)
Preoperative diagnosis: Bilateral renal stones Postoperative diagnosis: Same  Procedure: Cystoscopy with bilateral ureteroscopy, laser lithotripsy, stone basket extraction and ureteral stent exchange  Surgeon: Mena Goes  Anesthesia: General  Indication for procedure: Karla Scott is a 55 year old female with a history of stones.  She was stented urgently for an obstructing right renal stone but she also had significant left sided stone burden, therefore she was stented bilaterally.  She was taken for ureteroscopy bilateral with laser lithotripsy and stent replacement.  She followed up in the office and had a small fragment left in the right lower pole and a few larger fragments left in the left lower pole.  She was brought for a second look ureteroscopy today.  She had had fever after the last ureteroscopy and a blood culture was negative.  In office culture grew yeast and therefore I started her on Diflucan and cephalexin preoperatively.  I also added Diflucan to the Ancef for today.  Findings: On cystoscopy the urethra and bladder were unremarkable.  On left ureteroscopy the left UPJ is tight but the dual channel scope did pass through.  There were 2 larger fragments in the left lower pole which were repositioned to the upper pole and dusted and then stone dust throughout the left collecting system which was treated.  On the right there was a fragment remaining in the right lower pole and this was dusted.  Description of procedure: After consent was obtained patient brought to the operating room.  After adequate anesthesia she was placed on lithotomy position and prepped and draped in the usual sterile fashion.  A timeout was performed to confirm the patient and procedure.  The cystoscope was passed per urethra and the left stent grasped and removed through the urethral meatus and a sensor wire advanced to the collecting system.  I then used an access sheath, short, to place 2 wires and then went adjacent  to a zip wire.  I then passed the dual channel scope and it was a little tight through the left UPJ.  I passed a wire through it and slowly slid it over the wire and then went into the left collecting system without excessive force.  The collecting system was inspected and a 0 tip basket was used to drop to larger fragments from the lower pole up to the upper pole.  I then dusted a lower pole lateral and a lower pole inferior calyx.  I then went up and treated the 2 larger fragments in the upper pole and dusted some other fragments in the upper and middle poles.  Careful comparison and inspection with the fluoroscopy and then ureteroscopically noted there to be no other significant stone fragments.  The access sheath was backed out on the cystoscope and the collecting system renal pelvis UPJ and ureter were all carefully inspected on the way out noted to be normal without significant fragment or injury.  Attention was turned to the right side where the right stent was grasped and removed through the meatus and a sensor wire advanced.  I then again used the access sheath to get 2 wires and went adjacent to the sensor wire on the side leaving it as the safety.  The scope, dual channel digital, was passed up to the right collecting system and then down to the lower pole where the fragment was noted and it was grasped and dropped in the upper pole and dusted.  No other stone fragments were noted on the right side.  The access sheath  was backed out on the ureteroscope and the collecting system renal pelvis UPJ and ureter inspected on the way out noted to have no other stone fragment nor any injury.  Wire was backloaded on the cystoscope on the right side first and a 626 cm stent advanced.  Wire was removed with a good coil seen in the kidney and a good coil in the bladder.  In the left wire was backloaded into the cystoscope and a 626 cm stent advanced.  Wire was removed with a good coil seen in the collecting system and  a good coil in the bladder.  Bladder was drained and the scope removed.  A left strings on both stents and these were taped to the patient.  She was awakened taken recovery room in stable condition.  Complications: None  Blood loss: Minimal  Specimens: None  Drains: 6 x 26 cm bilateral ureteral stent with strings  Disposition: Patient stable to PACU

## 2021-07-23 NOTE — Transfer of Care (Signed)
Immediate Anesthesia Transfer of Care Note  Patient: Karla Scott  Procedure(s) Performed: Procedure(s) (LRB): CYSTOSCOPY WITH RETROGRADE PYELOGRAM/URETEROSCOPY/URETERAL STENT EXCHANGE (Bilateral)  Patient Location: PACU  Anesthesia Type: General  Level of Consciousness: awake, sedated, patient cooperative and responds to stimulation  Airway & Oxygen Therapy: Patient Spontanous Breathing and Patient connected to [place] 02 and soft FM   Post-op Assessment: Report given to PACU RN, Post -op Vital signs reviewed and stable and Patient moving all extremities  Post vital signs: Reviewed and stable  Complications: No apparent anesthesia complications

## 2021-07-23 NOTE — Interval H&P Note (Signed)
History and Physical Interval Note:  07/23/2021 9:30 AM  Karla Scott  has presented today for surgery, with the diagnosis of LEFT RENAL STONE.  The various methods of treatment have been discussed with the patient and family. After consideration of risks, benefits and other options for treatment, the patient has consented to  Procedure(s): CYSTOSCOPY WITH RETROGRADE PYELOGRAM/URETEROSCOPY/URETERAL STENT EXCHANGE (Left) and RIGHT with laser lithotripsy, BILATERAL (consent updated) as a surgical intervention.  The patient's history has been reviewed, patient examined, no change in status, stable for surgery.  KUB in office with L>>R residual stobe burden. Urine cx in office + yeast. I have reviewed the patient's chart and labs.  Questions were answered to the patient's satisfaction.  She has been taking cephalexin and another round of diflucan. I also added fluconazole to pre-op orders. She is well with no dysuria or fever. Urine clear.    Jerilee Field

## 2021-07-24 ENCOUNTER — Encounter (HOSPITAL_BASED_OUTPATIENT_CLINIC_OR_DEPARTMENT_OTHER): Payer: Self-pay | Admitting: Urology

## 2021-07-26 NOTE — Anesthesia Postprocedure Evaluation (Signed)
Anesthesia Post Note  Patient: Karla Scott  Procedure(s) Performed: CYSTOSCOPY WITH RETROGRADE PYELOGRAM/URETEROSCOPY/URETERAL STENT EXCHANGE (Bilateral: Ureter)     Patient location during evaluation: PACU Anesthesia Type: General Level of consciousness: sedated and patient cooperative Pain management: pain level controlled Vital Signs Assessment: post-procedure vital signs reviewed and stable Respiratory status: spontaneous breathing Cardiovascular status: stable Anesthetic complications: no   No notable events documented.  Last Vitals:  Vitals:   07/23/21 1130 07/23/21 1145  BP: 114/60 (!) 115/54  Pulse: 93 94  Resp: 20 20  Temp:    SpO2: 93% 93%    Last Pain:  Vitals:   07/23/21 1215  PainSc: 0-No pain                 [name]

## 2022-07-28 ENCOUNTER — Other Ambulatory Visit: Payer: Self-pay | Admitting: Internal Medicine

## 2022-07-28 DIAGNOSIS — Z1231 Encounter for screening mammogram for malignant neoplasm of breast: Secondary | ICD-10-CM

## 2022-08-08 IMAGING — CR DG CHEST 2V
2 series · 2 of 2 positions shown · non-contrast
Comparison: Report from chest radiograph dated 01/19/2016.

CLINICAL DATA: Fever.

EXAM:
CHEST - 2 VIEW

[w chest pa]
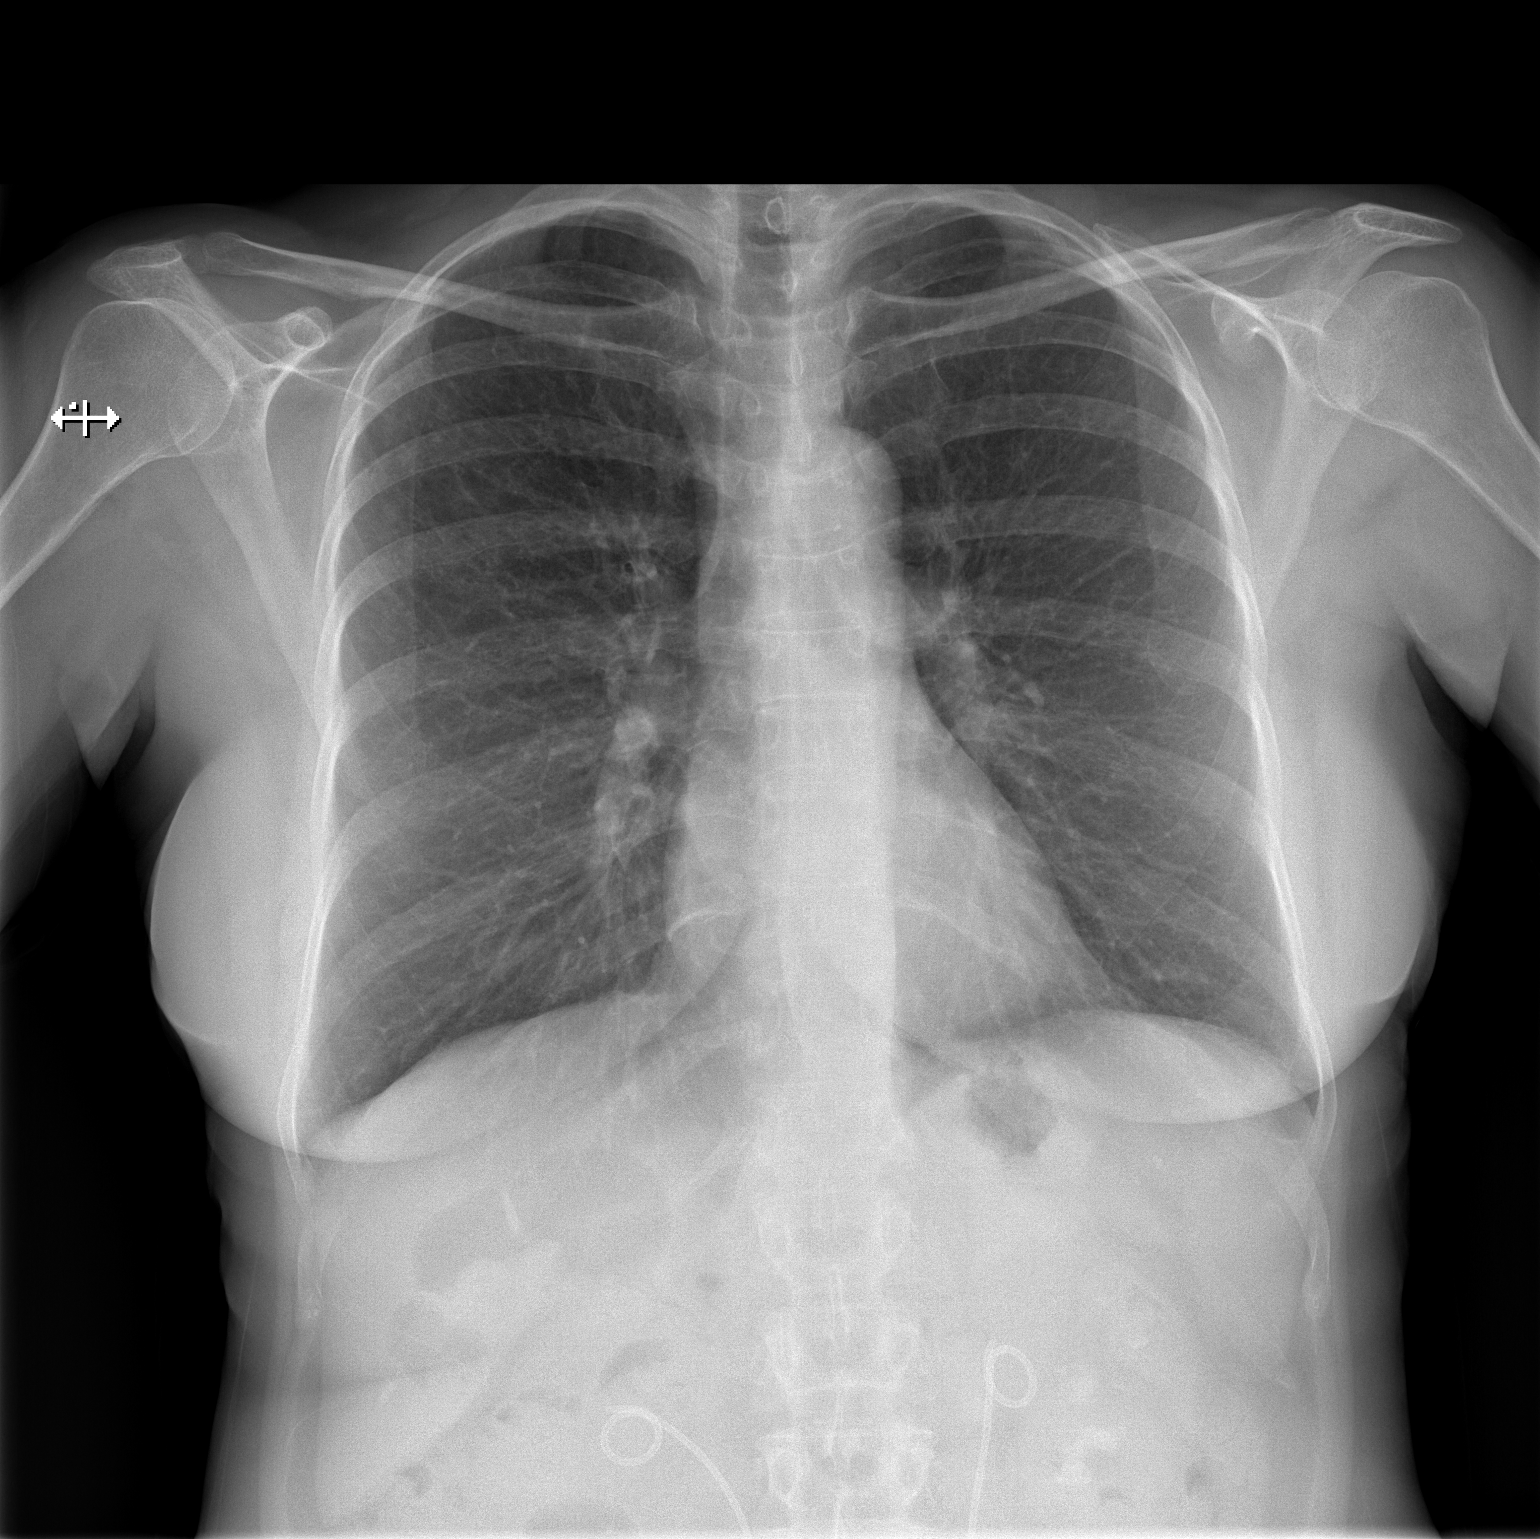

[w chest lat]
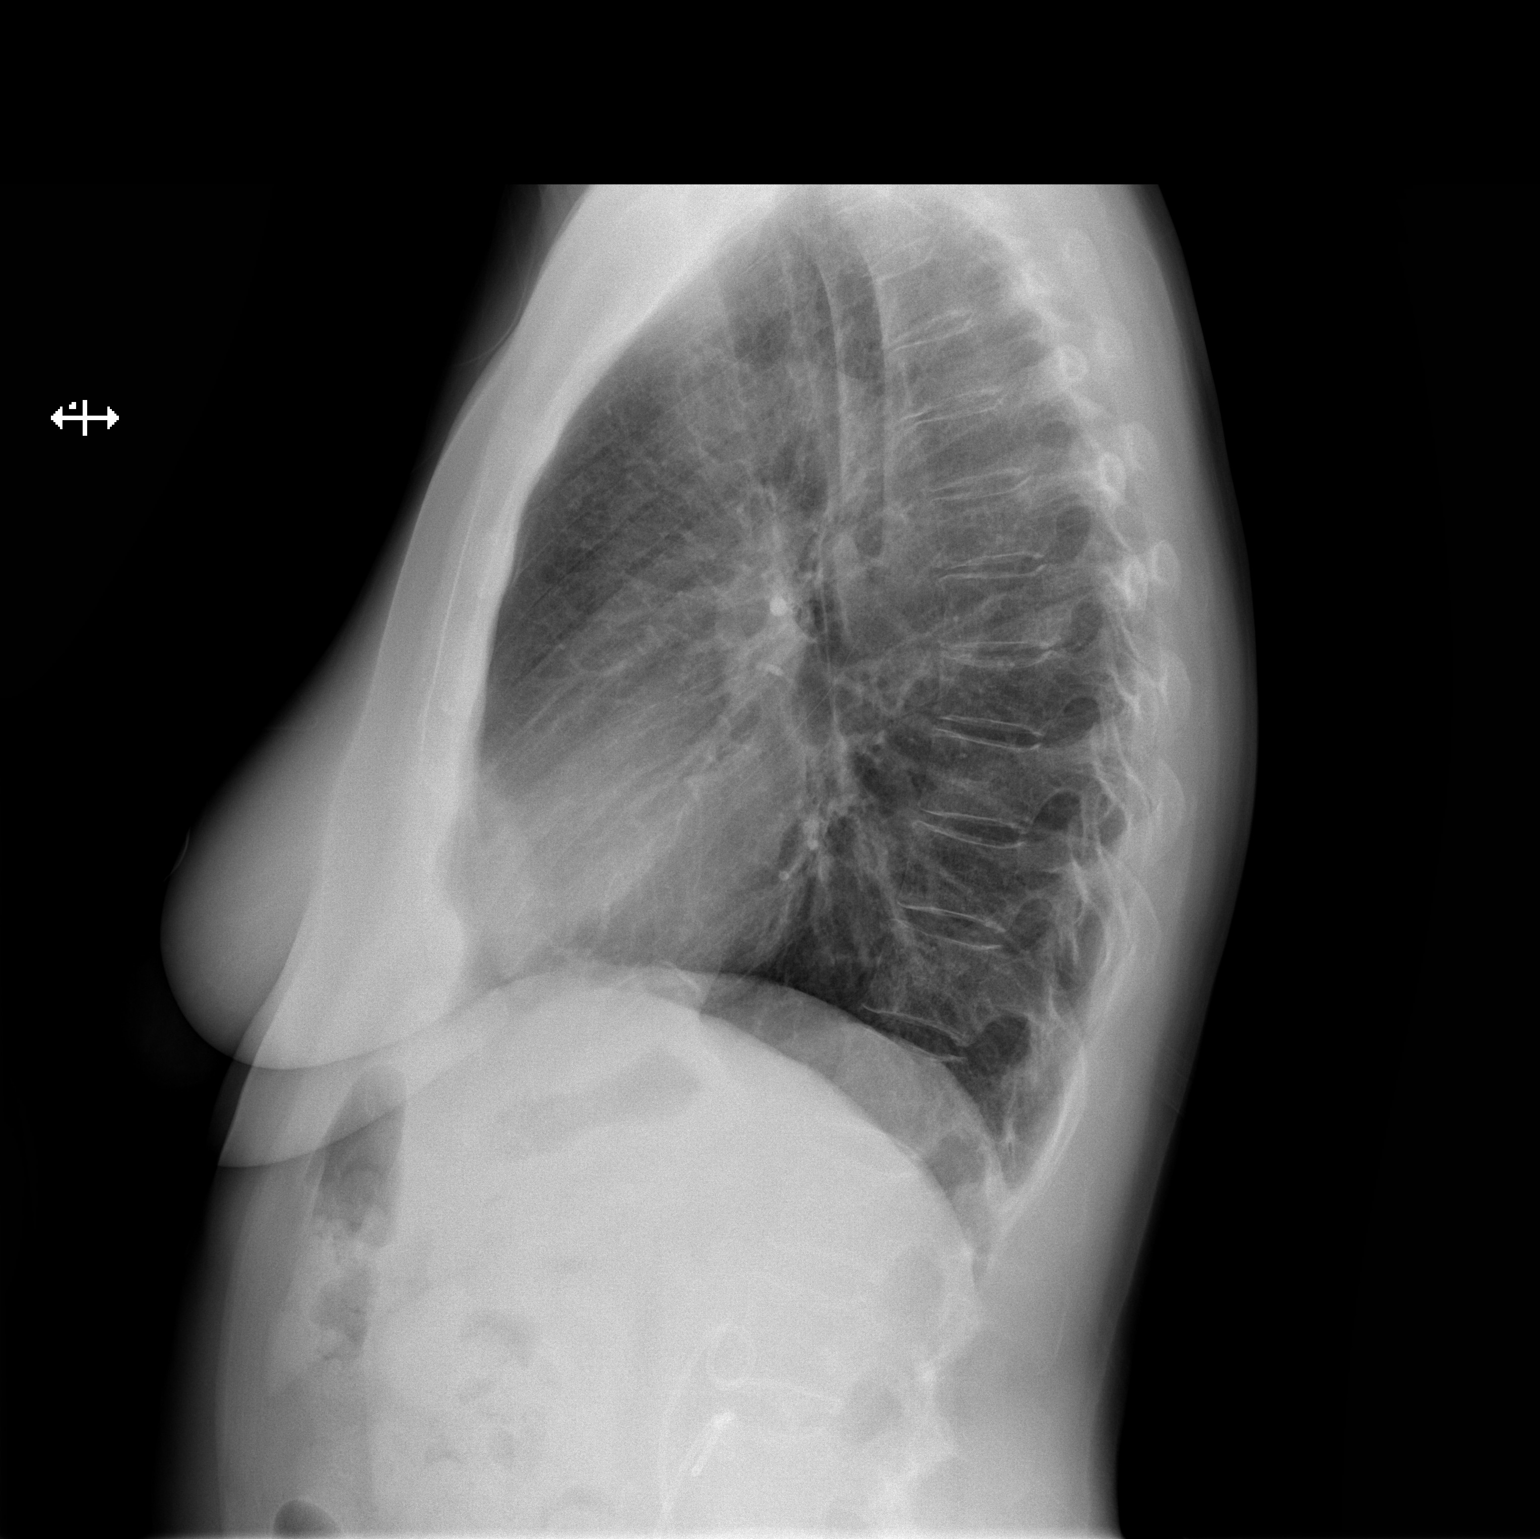

[2 of 2 positions shown; findings below may reference images not displayed]

FINDINGS: The heart size and mediastinal contours are within normal limits.
Both lungs are clear. The visualized skeletal structures are
unremarkable. Bilateral ureteral stents are partially imaged.
IMPRESSION: No active cardiopulmonary disease.

## 2022-08-13 ENCOUNTER — Inpatient Hospital Stay: Admission: RE | Admit: 2022-08-13 | Payer: BC Managed Care – PPO | Source: Ambulatory Visit

## 2022-10-13 ENCOUNTER — Ambulatory Visit
Admission: RE | Admit: 2022-10-13 | Discharge: 2022-10-13 | Disposition: A | Discharge status: Home / Self Care | Payer: BC Managed Care – PPO | Source: Ambulatory Visit | Attending: Internal Medicine | Admitting: Internal Medicine

## 2022-10-13 DIAGNOSIS — Z1231 Encounter for screening mammogram for malignant neoplasm of breast: Secondary | ICD-10-CM
